# Patient Record
Sex: Male | Born: 1935 | Race: Asian | Hispanic: No | Marital: Married | Smoking: Current every day smoker
Health system: Southern US, Community
[De-identification: ages and names within clinical notes are randomized; demographics above are authoritative.]

## PROBLEM LIST (undated history)

## (undated) DIAGNOSIS — K259 Gastric ulcer, unspecified as acute or chronic, without hemorrhage or perforation: Secondary | ICD-10-CM

## (undated) HISTORY — PX: APPENDECTOMY: SHX54

---

## 1972-02-16 HISTORY — PX: ABDOMINAL SURGERY: SHX537

## 2016-11-11 ENCOUNTER — Inpatient Hospital Stay (HOSPITAL_COMMUNITY): Payer: Self-pay

## 2016-11-11 ENCOUNTER — Encounter: Payer: Self-pay | Admitting: Emergency Medicine

## 2016-11-11 ENCOUNTER — Encounter (HOSPITAL_COMMUNITY): Payer: Self-pay | Admitting: Emergency Medicine

## 2016-11-11 ENCOUNTER — Inpatient Hospital Stay (HOSPITAL_COMMUNITY)
Admission: EM | Admit: 2016-11-11 | Discharge: 2016-11-14 | DRG: 470 | Disposition: A | Discharge status: Home / Self Care | Payer: Self-pay | Attending: Nephrology | Admitting: Nephrology

## 2016-11-11 ENCOUNTER — Emergency Department (INDEPENDENT_AMBULATORY_CARE_PROVIDER_SITE_OTHER): Payer: Self-pay

## 2016-11-11 ENCOUNTER — Emergency Department (HOSPITAL_COMMUNITY): Payer: Self-pay

## 2016-11-11 ENCOUNTER — Emergency Department
Admission: EM | Admit: 2016-11-11 | Discharge: 2016-11-11 | Disposition: A | Discharge status: Home / Self Care | Payer: Self-pay | Source: Home / Self Care | Attending: Family Medicine | Admitting: Family Medicine

## 2016-11-11 DIAGNOSIS — T148XXA Other injury of unspecified body region, initial encounter: Secondary | ICD-10-CM

## 2016-11-11 DIAGNOSIS — S72011A Unspecified intracapsular fracture of right femur, initial encounter for closed fracture: Secondary | ICD-10-CM

## 2016-11-11 DIAGNOSIS — M810 Age-related osteoporosis without current pathological fracture: Secondary | ICD-10-CM | POA: Diagnosis present

## 2016-11-11 DIAGNOSIS — W19XXXA Unspecified fall, initial encounter: Secondary | ICD-10-CM

## 2016-11-11 DIAGNOSIS — Z85028 Personal history of other malignant neoplasm of stomach: Secondary | ICD-10-CM

## 2016-11-11 DIAGNOSIS — W1830XA Fall on same level, unspecified, initial encounter: Secondary | ICD-10-CM | POA: Diagnosis present

## 2016-11-11 DIAGNOSIS — S72001A Fracture of unspecified part of neck of right femur, initial encounter for closed fracture: Secondary | ICD-10-CM

## 2016-11-11 DIAGNOSIS — S72009A Fracture of unspecified part of neck of unspecified femur, initial encounter for closed fracture: Secondary | ICD-10-CM | POA: Diagnosis present

## 2016-11-11 DIAGNOSIS — I959 Hypotension, unspecified: Secondary | ICD-10-CM

## 2016-11-11 DIAGNOSIS — F17210 Nicotine dependence, cigarettes, uncomplicated: Secondary | ICD-10-CM | POA: Diagnosis present

## 2016-11-11 HISTORY — DX: Gastric ulcer, unspecified as acute or chronic, without hemorrhage or perforation: K25.9

## 2016-11-11 HISTORY — PX: HEMIARTHROPLASTY HIP: SUR652

## 2016-11-11 LAB — PROTIME-INR
INR: 1.15
Prothrombin Time: 14.6 seconds (ref 11.4–15.2)

## 2016-11-11 LAB — CBC WITH DIFFERENTIAL/PLATELET
BASOS PCT: 0 %
Basophils Absolute: 0 10*3/uL (ref 0.0–0.1)
Eosinophils Absolute: 0.2 10*3/uL (ref 0.0–0.7)
Eosinophils Relative: 1 %
HEMATOCRIT: 40.5 % (ref 39.0–52.0)
HEMOGLOBIN: 13.8 g/dL (ref 13.0–17.0)
LYMPHS ABS: 1.5 10*3/uL (ref 0.7–4.0)
Lymphocytes Relative: 13 %
MCH: 30.3 pg (ref 26.0–34.0)
MCHC: 34.1 g/dL (ref 30.0–36.0)
MCV: 89 fL (ref 78.0–100.0)
MONOS PCT: 7 %
Monocytes Absolute: 0.8 10*3/uL (ref 0.1–1.0)
NEUTROS ABS: 8.5 10*3/uL — AB (ref 1.7–7.7)
NEUTROS PCT: 79 %
Platelets: 194 10*3/uL (ref 150–400)
RBC: 4.55 MIL/uL (ref 4.22–5.81)
RDW: 15.4 % (ref 11.5–15.5)
WBC: 10.9 10*3/uL — ABNORMAL HIGH (ref 4.0–10.5)

## 2016-11-11 LAB — BASIC METABOLIC PANEL
ANION GAP: 10 (ref 5–15)
BUN: 15 mg/dL (ref 6–20)
CHLORIDE: 103 mmol/L (ref 101–111)
CO2: 25 mmol/L (ref 22–32)
Calcium: 9 mg/dL (ref 8.9–10.3)
Creatinine, Ser: 0.81 mg/dL (ref 0.61–1.24)
GFR calc non Af Amer: >60 mL/min (ref >60)
Glucose, Bld: 119 mg/dL — ABNORMAL HIGH (ref 65–99)
Potassium: 3.6 mmol/L (ref 3.5–5.1)
Sodium: 138 mmol/L (ref 135–145)

## 2016-11-11 LAB — TYPE AND SCREEN
ABO/RH(D): AB POS
ABO/RH(D): AB POS
ANTIBODY SCREEN: NEGATIVE
Antibody Screen: NEGATIVE

## 2016-11-11 LAB — ABO/RH
ABO/RH(D): AB POS
ABO/RH(D): AB POS

## 2016-11-11 MED ORDER — POLYETHYLENE GLYCOL 3350 17 G PO PACK
17.0000 g | PACK | Freq: Every day | ORAL | Status: DC | PRN
  Administered 2016-11-11 – 2016-11-14 (×3): 17 g via ORAL
  Filled 2016-11-11 (×2): qty 1

## 2016-11-11 MED ORDER — ENOXAPARIN SODIUM 40 MG/0.4ML ~~LOC~~ SOLN: 40.0000 mg | SUBCUTANEOUS | Status: DC

## 2016-11-11 MED ORDER — OXYCODONE HCL 5 MG PO TABS
5.0000 mg | ORAL_TABLET | ORAL | Status: DC | PRN
  Administered 2016-11-11 (×2): 5 mg via ORAL
  Filled 2016-11-11 (×2): qty 1

## 2016-11-11 MED ORDER — MORPHINE SULFATE (PF) 4 MG/ML IV SOLN: 0.5000 mg | INTRAVENOUS | Status: DC | PRN

## 2016-11-11 MED ORDER — FENTANYL CITRATE (PF) 100 MCG/2ML IJ SOLN
50.0000 ug | Freq: Once | INTRAMUSCULAR | Status: AC
  Administered 2016-11-11: 50 ug via INTRAVENOUS
  Filled 2016-11-11: qty 2

## 2016-11-11 MED ORDER — MORPHINE SULFATE (PF) 2 MG/ML IV SOLN: 0.5000 mg | INTRAVENOUS | Status: DC | PRN

## 2016-11-11 NOTE — H&P (Signed)
[facility] History and Physical  Caleb Hull ZOX:[id number] DOB: [birth year] DOA: 11/11/2016  Referring physician:  PCP: Patient, No Pcp Per   Chief Complaint:    HPI:  83 year old male with a  remote history of gastric cancer, who presents to the ED after a fall while playing ping-pong yesterday. Patient is  physically very fit, according to the son smokes one pack a day. Has no history of hypertension, no history of pulmonary disease, no cardiac disease. No history of atrial fibrillation and congestive heart failure. Patient denies any recent illness, denies loss of consciousness or head trauma ED course BP 124/60 (BP Location: Right Arm)   Pulse 62   Temp 98.6 F (37 C) (Oral)   Resp 16   SpO2 96%  Patient is pain-free, hemodynamically stable, chest x-ray negative, shows tiny bilateral pleural effusions, x-ray of the hip showed subcapital femoral neck fracture. Patient is being transferred to [facility] for repair. EDP already spoke with Dr. Everardo Pacific, Orthopedics.     Review of Systems: negative for the following  Constitutional: Denies fever, chills, diaphoresis, appetite change and fatigue.  HEENT: Denies photophobia, eye pain, redness, hearing loss, ear pain, congestion, sore throat, rhinorrhea, sneezing, mouth sores, trouble swallowing, neck pain, neck stiffness and tinnitus.  Respiratory: Denies SOB, DOE, cough, chest tightness, and wheezing.  Cardiovascular: Denies chest pain, palpitations and leg swelling.  Gastrointestinal: Denies nausea, vomiting, abdominal pain, diarrhea, constipation, blood in stool and abdominal distention.  Genitourinary: Denies dysuria, urgency, frequency, hematuria, flank pain and difficulty urinating.  Musculoskeletal:  Right hip pain, difficulty ambulating Skin: Denies pallor, rash and wound.  Neurological: Denies dizziness, seizures, syncope, weakness, light-headedness, numbness and headaches.  Hematological: Denies adenopathy. Easy bruising,  personal or family bleeding history  Psychiatric/Behavioral: Denies suicidal ideation, mood changes, confusion, nervousness, sleep disturbance and agitation       Past Medical History:  Diagnosis Date  . Gastric ulcer      Past Surgical History:  Procedure Laterality Date  . ABDOMINAL SURGERY        Social History:  reports that he has been smoking Cigarettes.  He has a 70.00 pack-year smoking history. He has never used smokeless tobacco. He reports that he does not drink alcohol or use drugs.    No Known Allergies      FAMILY HISTORY  When questioned  Directly-patient reports  No family history of HTN, CVA ,DIABETES, TB, Cancer CAD, Bleeding Disorders, Sickle Cell, diabetes, anemia, asthma,   Prior to Admission medications   Medication Sig Start Date End Date Taking? Authorizing Provider  ibuprofen (ADVIL,MOTRIN) 200 MG tablet Take 200 mg by mouth every 6 (six) hours as needed for fever, headache, mild pain, moderate pain or cramping.    Yes [provider]     Physical Exam: Vitals:   11/11/16 1304  BP: 124/60  Pulse: 62  Resp: 16  Temp: 98.6 F (37 C)  TempSrc: Oral  SpO2: 96%        Vitals:   11/11/16 1304  BP: 124/60  Pulse: 62  Resp: 16  Temp: 98.6 F (37 C)  TempSrc: Oral  SpO2: 96%   Constitutional: NAD, calm, comfortable Eyes: PERRL, lids and conjunctivae normal ENMT: Mucous membranes are moist. Posterior pharynx clear of any exudate or lesions.Normal dentition.  Neck: normal, supple, no masses, no thyromegaly Respiratory: clear to auscultation bilaterally, no wheezing, no crackles. Normal respiratory effort. No accessory muscle use.  Cardiovascular: Regular rate and rhythm, no murmurs / rubs / gallops.  No extremity edema. 2+ pedal pulses. No carotid bruits.  Abdomen: no tenderness, no masses palpated. No hepatosplenomegaly. Bowel sounds positive.  Musculoskeletal: no clubbing / cyanosis. No joint deformity upper and lower  extremities. Good ROM, no contractures. Normal muscle tone.  Skin: no rashes, lesions, ulcers. No induration Neurologic: CN 2-12 grossly intact. Sensation intact, DTR normal. Strength 5/5 in all 4.  Psychiatric: Normal judgment and insight. Alert and oriented x 3. Normal mood.     Labs on Admission: I have personally reviewed following labs and imaging studies  CBC:  Recent Labs Lab 11/11/16 1356  WBC 10.9*  NEUTROABS 8.5*  HGB 13.8  HCT 40.5  MCV 89.0  PLT 194    Basic Metabolic Panel:  Recent Labs Lab 11/11/16 1356  NA 138  K 3.6  CL 103  CO2 25  GLUCOSE 119*  BUN 15  CREATININE 0.81  CALCIUM 9.0    GFR: Estimated Creatinine Clearance: 49.3 mL/min (by C-G formula based on SCr of 0.81 mg/dL).  Liver Function Tests: No results for input(s): AST, ALT, ALKPHOS, BILITOT, PROT, ALBUMIN in the last 168 hours. No results for input(s): LIPASE, AMYLASE in the last 168 hours. No results for input(s): AMMONIA in the last 168 hours.  Coagulation Profile: No results for input(s): INR, PROTIME in the last 168 hours. No results for input(s): DDIMER in the last 72 hours.  Cardiac Enzymes: No results for input(s): CKTOTAL, CKMB, CKMBINDEX, TROPONINI in the last 168 hours.  BNP (last 3 results) No results for input(s): PROBNP in the last 8760 hours.  HbA1C: No results for input(s): HGBA1C in the last 72 hours. No results found for: HGBA1C   CBG: No results for input(s): GLUCAP in the last 168 hours.  Lipid Profile: No results for input(s): CHOL, HDL, LDLCALC, TRIG, CHOLHDL, LDLDIRECT in the last 72 hours.  Thyroid Function Tests: No results for input(s): TSH, T4TOTAL, FREET4, T3FREE, THYROIDAB in the last 72 hours.  Anemia Panel: No results for input(s): VITAMINB12, FOLATE, FERRITIN, TIBC, IRON, RETICCTPCT in the last 72 hours.  Urine analysis: No results found for: COLORURINE, APPEARANCEUR, LABSPEC, PHURINE, GLUCOSEU, HGBUR, BILIRUBINUR, KETONESUR, PROTEINUR,  UROBILINOGEN, NITRITE, LEUKOCYTESUR  Sepsis Labs: (procalcitonin:4,lacticidven:4) )No results found for this or any previous visit (from the past 240 hour(s)).       Radiological Exams on Admission: Dg Hip Unilat W Or Wo Pelvis 2-3 Views Right  Addendum Date: 11/11/2016   ADDENDUM REPORT: 11/11/2016 10:43 ADDENDUM: There is a subcapital femoral neck fracture on the right, best seen on lateral view. I discussed this finding with the referring physician. Electronically Signed   By: Bretta Bang III M.D.   On: 11/11/2016 10:43   Result Date: 11/11/2016 CLINICAL DATA:  Pain following fall EXAM: DG HIP (WITH OR WITHOUT PELVIS) 2-3V RIGHT COMPARISON:  None. FINDINGS: Frontal pelvis as well as frontal and lateral right hip images were obtained. Bones are osteoporotic. No acute fracture or dislocation. There is slight narrowing of each hip joint. No erosive change. Sacroiliac joints appear unremarkable. IMPRESSION: No fracture or dislocation. Slight symmetric narrowing both hip joints. Bones are osteoporotic. Electronically Signed: By: Bretta Bang III M.D. On: 11/11/2016 10:22   Dg Hip Unilat W Or Wo Pelvis 2-3 Views Right  Addendum Date: 11/11/2016   ADDENDUM REPORT: 11/11/2016 10:43 ADDENDUM: There is a subcapital femoral neck fracture on the right, best seen on lateral view. I discussed this finding with the referring physician. Electronically Signed   By: Bretta Bang III M.D.  On: 11/11/2016 10:43   Result Date: 11/11/2016 CLINICAL DATA:  Pain following fall EXAM: DG HIP (WITH OR WITHOUT PELVIS) 2-3V RIGHT COMPARISON:  None. FINDINGS: Frontal pelvis as well as frontal and lateral right hip images were obtained. Bones are osteoporotic. No acute fracture or dislocation. There is slight narrowing of each hip joint. No erosive change. Sacroiliac joints appear unremarkable. IMPRESSION: No fracture or dislocation. Slight symmetric narrowing both hip joints. Bones are  osteoporotic. Electronically Signed: By: Bretta Bang III M.D. On: 11/11/2016 10:22      EKG: Independently reviewed.  Sinus rhythm RSR' in V1 or V2, probably normal variant No old tracing to compare Nonspecific ST and T wave abnormality   Assessment/Plan Principal Problem:   Subcapital fracture of femur, right, closed, initial encounter (HCC)  Hip fracture protocol initiated Low risk for surgery given no previous medical history DVT prophylaxis per orthopedics Follow-up CBC.    DVT prophylaxis:  Lovenox     Code Status Orders full code        consults called: Orthopedics  Family Communication: Admission, patients condition and plan of care including tests being ordered have been discussed with the patient  who indicates understanding and agree with the plan and Code Status  Admission status: inpatient   Disposition plan: Further plan will depend as patient's clinical course evolves and further radiologic and laboratory data become available. Likely home when stable   At the time of admission, it appears that the appropriate admission status for this patient is INPATIENT .Thisis judged to be reasonable and necessary in order to provide the required intensity of service to ensure the patient's safetygiven thepresenting symptoms, physical exam findings, and initial radiographic and laboratory data in the context of their chronic comorbidities.   Richarda Overlie MD [facility] Pager [phone]  If 7PM-7AM, please contact night-coverage [email or web address] Password [email or web address]  11/11/2016, 2:40 PM

## 2016-11-11 NOTE — ED Triage Notes (Signed)
Upper right thigh injury from fall last night playing ping pong.

## 2016-11-11 NOTE — ED Triage Notes (Signed)
Patient BIB daughter and son-in-law, visiting from [place], reports fall last night. Sent from [facility] for positive femoral fracture. Patient denies pain at this time.

## 2016-11-11 NOTE — ED Provider Notes (Signed)
[facility]    CSN: [id number] Arrival date & time: 11/11/16  0902     History   Chief Complaint Chief Complaint  Patient presents with  . Fall    HPI Caleb Hull is a 83 y.o. male.  Pt is [place], accompanied by son-in-law who speaks fluent [place]. Declined professional interpreter.   HPI  Caleb Hull is a 83 y.o. male presenting to UC with son-in-law and daughter with c/o sudden onset Right hip pain that started last night while playing ping-pong. Pt was stretched out to his Right trying to reach the ball but he wound up falling "softly" onto his Right side.  Since then, pt has been hesitant to bear weight on Right leg due to pain.  Pt typically is very active and does not use a walker or cane. Today, he cannot ambulate w/o assistance.  No other injuries. Denies hitting his head during the fall. He is not on any medications.  They have tried a topical skin patch with medication from [place] but no relief.  He has not noticed bruising to his leg or hip.   History reviewed. No pertinent past medical history.  Patient Active Problem List   Diagnosis Date Noted  . Subcapital fracture of femur, right, closed, initial encounter (HCC) 11/11/2016    Past Surgical History:  Procedure Laterality Date  . ABDOMINAL SURGERY         Home Medications    Prior to Admission medications   Medication Sig Start Date End Date Taking? Authorizing Provider  ibuprofen (ADVIL,MOTRIN) 200 MG tablet Take 200 mg by mouth every 6 (six) hours as needed.   Yes [provider]    Family History No family history on file.  Social History Social History  Substance Use Topics  . Smoking status: Current Every Day Smoker    Packs/day: 1.00    Years: 70.00    Types: Cigarettes  . Smokeless tobacco: Never Used  . Alcohol use No     Allergies   Patient has no known allergies.   Review of Systems Review of Systems  Musculoskeletal: Positive for arthralgias and  myalgias. Negative for joint swelling.  Skin: Negative for color change and wound.  Neurological: Positive for weakness (Right leg/hip due to pain). Negative for numbness.     Physical Exam Triage Vital Signs ED Triage Vitals  Enc Vitals Group     BP 11/11/16 0937 117/63     Pulse Rate 11/11/16 0937 62     Resp --      Temp 11/11/16 0937 98.9 F (37.2 C)     Temp Source 11/11/16 0937 Oral     SpO2 11/11/16 0937 97 %     Weight 11/11/16 0939 113 lb (51.3 kg)     Height 11/11/16 0939  (1.651 m)     Head Circumference --      Peak Flow --      Pain Score 11/11/16 0939 7     Pain Loc --      Pain Edu? --      Excl. in GC? --    No data found.   Updated Vital Signs BP 117/63 (BP Location: Left Arm)   Pulse 62   Temp 98.9 F (37.2 C) (Oral)   Ht  (1.651 m)   Wt 113 lb (51.3 kg)   SpO2 97%   BMI 18.80 kg/m   Visual Acuity Right Eye Distance:   Left Eye Distance:   Bilateral  Distance:    Right Eye Near:   Left Eye Near:    Bilateral Near:     Physical Exam  Constitutional: He is oriented to person, place, and time. He appears well-developed and well-nourished. No distress.  Pt sitting in a wheelchair, NAD  HENT:  Head: Normocephalic and atraumatic.  Eyes: EOM are normal.  Neck: Normal range of motion.  Cardiovascular: Normal rate.   Pulmonary/Chest: Effort normal.  Musculoskeletal: He exhibits tenderness. He exhibits no edema.  Right leg: minimally internally rotated. Minimal tenderness to hip joint. Tenderness to anterior thigh. Severe pain with attempts to move Right hip. Calf is soft, non-tender. Did not attempt to stand pt due to severe pain.  Neurological: He is alert and oriented to person, place, and time.  Skin: Skin is warm and dry. He is not diaphoretic.  Right leg: skin in tact. No ecchymosis or erythema  Psychiatric: He has a normal mood and affect. His behavior is normal.  Nursing note and vitals reviewed.    UC Treatments / Results   Labs (all labs ordered are listed, but only abnormal results are displayed) Labs Reviewed - No data to display  EKG  EKG Interpretation None       Radiology Dg Hip Unilat W Or Wo Pelvis 2-3 Views Right  Addendum Date: 11/11/2016   ADDENDUM REPORT: 11/11/2016 10:43 ADDENDUM: There is a subcapital femoral neck fracture on the right, best seen on lateral view. I discussed this finding with the referring physician. Electronically Signed   By: Bretta Bang III M.D.   On: 11/11/2016 10:43   Result Date: 11/11/2016 CLINICAL DATA:  Pain following fall EXAM: DG HIP (WITH OR WITHOUT PELVIS) 2-3V RIGHT COMPARISON:  None. FINDINGS: Frontal pelvis as well as frontal and lateral right hip images were obtained. Bones are osteoporotic. No acute fracture or dislocation. There is slight narrowing of each hip joint. No erosive change. Sacroiliac joints appear unremarkable. IMPRESSION: No fracture or dislocation. Slight symmetric narrowing both hip joints. Bones are osteoporotic. Electronically Signed: By: Bretta Bang III M.D. On: 11/11/2016 10:22    Procedures Procedures (including critical care time)  Medications Ordered in UC Medications - No data to display   Initial Impression / Assessment and Plan / UC Course  I have reviewed the triage vital signs and the nursing notes.  Pertinent labs & imaging results that were available during my care of the patient were reviewed by me and considered in my medical decision making (see chart for details).     Pt presenting to UC with Right hip pain and unable to bear weight Plain films c/w closed fracture at femoral neck.    Consulted with Dr. Benjamin Stain who also evaluated pt. Recommends pt be seen by an orthopedic surgeon today for emergent surgery.  See consult note.   Pt discharged in stable condition via POV to [facility].     Final Clinical Impressions(s) / UC Diagnoses   Final diagnoses:  Closed fracture of neck of right femur,  initial encounter [facility])    New Prescriptions Discharge Medication List as of 11/11/2016 11:59 AM       Controlled Substance Prescriptions  [place] Controlled Substance Registry consulted? Not Applicable   Rolla Plate 11/11/16 1211

## 2016-11-11 NOTE — Consult Note (Signed)
   Subjective:    I'm seeing this patient as a consultation for:  Waylan Rocher PA-C  CC: right hip pain  HPI: This is a pleasant 83 year old male visiting from [place], he was playing ping-pong and fell, he had immediate pain at his right hip, and difficulty bearing weight. He was able to minimally bear weight with help from his family. He was brought to urgent care today, where x-rays showed a subcapital femoral neck fracture. Pain is severe, persistent, he does not speaking which.  Past medical history, Surgical history, Family history not pertinant except as noted below, Social history, Allergies, and medications have been entered into the medical record, reviewed, and no changes needed.   Review of Systems: No headache, visual changes, nausea, vomiting, diarrhea, constipation, dizziness, abdominal pain, skin rash, fevers, chills, night sweats, weight loss, swollen lymph nodes, body aches, joint swelling, muscle aches, chest pain, shortness of breath, mood changes, visual or auditory hallucinations.   Objective:   General: Well Developed, well nourished, and in no acute distress.  Neuro:  Extra-ocular muscles intact, able to move all 4 extremities, sensation grossly intact.  Deep tendon reflexes tested were normal. Psych: Alert and oriented, mood congruent with affect. ENT:  Ears and nose appear unremarkable.  Hearing grossly normal. Neck: Unremarkable overall appearance, trachea midline.  No visible thyroid enlargement. Eyes: Conjunctivae and lids appear unremarkable.  Pupils equal and round. Skin: Warm and dry, no rashes noted.  Cardiovascular: Pulses palpable, no extremity edema. Right hip: Tender diffusely, severe pain with internal rotation.  X-rays personally reviewed and show a minimally displaced some Fracture of the right femoral neck, x-rays discussed with radiology.  Impression and Recommendations:   This case required medical decision making of moderate  complexity.  Subcapital fracture of femur, right, closed, initial encounter (HCC) Subcapital femoral neck fracture, displaced. This is going to need surgical fixation versus arthroplasty today. Patient will be referred to [facility]. This is a healthy male on no medications, greater than 4 metabolic equivalents of exercise tolerance.  ___________________________________________ Ihor Austin. Benjamin Stain, M.D., ABFM., CAQSM. Primary Care and Sports Medicine [facility]  Adjunct Instructor of Family Medicine  [employer]

## 2016-11-11 NOTE — ED Notes (Signed)
[facility] WITH PT WITH [facility]

## 2016-11-11 NOTE — Discharge Instructions (Signed)
° °  When you get to [facility], go to the emergency department and pull right up front. Let staff know you need help getting him out of the car due to a confirmed Right hip fracture.  Dr. Karie Schwalbe from [facility] has spoken with surgeon and directed you to [facility].

## 2016-11-11 NOTE — ED Notes (Signed)
Coming from a sports medicine office-has a femoral fracture-Ortho MD on call is aware patient is coming to ED for work up for possible surgery

## 2016-11-11 NOTE — Assessment & Plan Note (Addendum)
Subcapital femoral neck fracture, displaced. This is going to need surgical fixation versus arthroplasty today. Patient will be referred to [facility]. This is a healthy male on no medications, greater than 4 metabolic equivalents of exercise tolerance.

## 2016-11-11 NOTE — Clinical Social Work Note (Signed)
Clinical Social Work Assessment  Patient Details  Name: Caleb Hull MRN: [id number] Date of Birth: [birth year]  Date of referral:  11/11/16               Reason for consult:  Facility Placement                Permission sought to share information with:  Oceanographer granted to share information::  No  Name::        Agency::     Relationship::     Contact Information:     Housing/Transportation Living arrangements for the past 2 months:  Single Family Home Source of Information:   (Pt's son) Patient Interpreter Needed:  None Criminal Activity/Legal Involvement Pertinent to Current Situation/Hospitalization:    Significant Relationships:  Adult Children Lives with:   (Currently stayting with his son, after pt recently visited from [place]) Do you feel safe going back to the place where you live?  Yes Need for family participation in patient care:  Yes (Comment) (Pt's son translates for his father)  Care giving concerns:  None listed by pt/family.  Pt's son states family has no need of HHC or SNF REHAB, but will return to [place] where "healthcare is taken care of", per pt's son.  Per pt's son Caleb Hull "family has everything taken care of" and pt will return to son's house until "he can get around" and then pt will return to [place], per pt's son.   Social Worker assessment / plan:  CSW spoke with pt's son by phone and confirmed pt's plan to be discharged to pt's son's house to recuperate until returning to [place].  CSW provided active listening and validated pt's son's concerns that pt needs no services.   CSW DEP was NOT given permission to complete FL-2 and send referrals out to SNF facilities via the hub per pt's request.  Pt does NOT have insurance.  Pt has been at his son's home while visiting from [place], prior to being admitted to [facility].   Employment status:  Retired Health and safety inspector:  Self Pay (Medicaid Pending) PT Recommendations:  Not assessed at this  time Information / Referral to community resources:     Patient/Family's Response to care:  Patient/family is realistic regarding therapy needs and expressed being hopeful pt can "get around quickly" and return to [place] for care. Patient's son speaks [place], pt does not and pt expressed understanding of CSW role and discharge process. No questions/concerns about plan or treatment.    Patient/Family's Understanding of and Emotional Response to Diagnosis, Current Treatment, and Prognosis:  Still assessing  Emotional Assessment Appearance:   (Unknown, CSW spoke by phone) Attitude/Demeanor/Rapport:  Unable to Assess Affect (typically observed):    Orientation:   (Unknown) Alcohol / Substance use:    Psych involvement (Current and /or in the community):     Discharge Needs  Concerns to be addressed:  No discharge needs identified Readmission within the last 30 days:  No Current discharge risk:  None Barriers to Discharge:  No Barriers Identified   Mercy Riding, LCSWA 11/11/2016, 9:43 PM

## 2016-11-11 NOTE — Progress Notes (Signed)
Called about patient this afternoon.  Due to timing and OR availability plan for OR tomorrow with Dr. Veda Canning.  Patient posted, hold chemical dvt prophylaxis and please make NPO at midnight.  Discussed plan with Dr. Susie Cassette.

## 2016-11-11 NOTE — ED Provider Notes (Addendum)
[facility]-EMERGENCY DEPT Provider Note   CSN: [id number] Arrival date & time: 11/11/16  1229     History   Chief Complaint Chief Complaint  Patient presents with  . Hip Injury    HPI Caleb Hull is a 83 y.o. male.  HPI Pt comes in with cc of fall. Pt had a fall whilst playing ping pong yday.He has no medical hx. He was unable to get up and walk, and the pain progressed so he was seen at [facility] where they noted that pt had a fracture. He denies n/v/f/c. No headaches. Pt is not on aspirin on blood thinners.  Son translated. Pt and son insisted on not needing translation services.  Past Medical History:  Diagnosis Date  . Gastric ulcer     Patient Active Problem List   Diagnosis Date Noted  . Subcapital fracture of femur, right, closed, initial encounter (HCC) 11/11/2016    Past Surgical History:  Procedure Laterality Date  . ABDOMINAL SURGERY         Home Medications    Prior to Admission medications   Medication Sig Start Date End Date Taking? Authorizing Provider  ibuprofen (ADVIL,MOTRIN) 200 MG tablet Take 200 mg by mouth every 6 (six) hours as needed.    [provider]    Family History No family history on file.  Social History Social History  Substance Use Topics  . Smoking status: Current Every Day Smoker    Packs/day: 1.00    Years: 70.00    Types: Cigarettes  . Smokeless tobacco: Never Used  . Alcohol use No     Allergies   Patient has no known allergies.   Review of Systems Review of Systems  Constitutional: Positive for activity change.  Respiratory: Negative for stridor.   Cardiovascular: Negative for chest pain.  Musculoskeletal: Positive for gait problem.  Neurological: Negative for headaches.  Hematological: Does not bruise/bleed easily.     Physical Exam Updated Vital Signs BP 124/60 (BP Location: Right Arm)   Pulse 62   Temp 98.6 F (37 C) (Oral)   Resp 16   SpO2 96%   Physical Exam  Constitutional: He is  oriented to person, place, and time. He appears well-developed.  HENT:  Head: Atraumatic.  Neck: Neck supple.  Cardiovascular: Normal rate.   Pulmonary/Chest: Effort normal.  Musculoskeletal:  Pt has tenderness over the R hip  Neurological: He is alert and oriented to person, place, and time.  Skin: Skin is warm.  Nursing note and vitals reviewed.    ED Treatments / Results  Labs (all labs ordered are listed, but only abnormal results are displayed) Labs Reviewed  CBC WITH DIFFERENTIAL/PLATELET - Abnormal; Notable for the following:       Result Value   WBC 10.9 (*)    Neutro Abs 8.5 (*)    All other components within normal limits  BASIC METABOLIC PANEL  PROTIME-INR  TYPE AND SCREEN    EKG  EKG Interpretation  Date/Time:  Thursday November 11 2016 14:13:19 EDT Ventricular Rate:  63 PR Interval:    QRS Duration: 90 QT Interval:  456 QTC Calculation: 467 R Axis:   62 Text Interpretation:  Sinus rhythm RSR' in V1 or V2, probably normal variant No old tracing to compare Nonspecific ST and T wave abnormality Confirmed by Derwood Kaplan [phone]) on 11/11/2016 2:19:35 PM       Radiology Dg Hip Unilat W Or Wo Pelvis 2-3 Views Right  Addendum Date: 11/11/2016   ADDENDUM REPORT:  11/11/2016 10:43 ADDENDUM: There is a subcapital femoral neck fracture on the right, best seen on lateral view. I discussed this finding with the referring physician. Electronically Signed   By: Bretta Bang III M.D.   On: 11/11/2016 10:43   Result Date: 11/11/2016 CLINICAL DATA:  Pain following fall EXAM: DG HIP (WITH OR WITHOUT PELVIS) 2-3V RIGHT COMPARISON:  None. FINDINGS: Frontal pelvis as well as frontal and lateral right hip images were obtained. Bones are osteoporotic. No acute fracture or dislocation. There is slight narrowing of each hip joint. No erosive change. Sacroiliac joints appear unremarkable. IMPRESSION: No fracture or dislocation. Slight symmetric narrowing both hip joints.  Bones are osteoporotic. Electronically Signed: By: Bretta Bang III M.D. On: 11/11/2016 10:22    Procedures Procedures (including critical care time)  Medications Ordered in ED Medications  fentaNYL (SUBLIMAZE) injection 50 mcg (50 mcg Intravenous Given 11/11/16 1414)     Initial Impression / Assessment and Plan / ED Course  I have reviewed the triage vital signs and the nursing notes.  Pertinent labs & imaging results that were available during my care of the patient were reviewed by me and considered in my medical decision making (see chart for details).     DDx includes: - Mechanical falls - ICH - Fractures - Contusions - Soft tissue injury  Pt comes in with a mechanical fall and he ended up with a R femoral neck fracture. Brain and cspine cleared clinically. Spoke with Dr. Everardo Pacific, Orthopedics. He wants medicine to admit, keep patient npo and transfer to [facility] - they migjht be able to operate later today.   Final Clinical Impressions(s) / ED Diagnoses   Final diagnoses:  Closed fracture of neck of right femur, initial encounter [facility])    New Prescriptions New Prescriptions   No medications on file     Derwood Kaplan, MD 11/11/16 1404    Derwood Kaplan, MD 11/11/16 1419

## 2016-11-12 ENCOUNTER — Inpatient Hospital Stay (HOSPITAL_COMMUNITY): Payer: Self-pay | Admitting: Anesthesiology

## 2016-11-12 ENCOUNTER — Encounter (HOSPITAL_COMMUNITY): Payer: Self-pay | Admitting: *Deleted

## 2016-11-12 ENCOUNTER — Telehealth: Payer: Self-pay | Admitting: Emergency Medicine

## 2016-11-12 ENCOUNTER — Inpatient Hospital Stay (HOSPITAL_COMMUNITY): Payer: Self-pay

## 2016-11-12 ENCOUNTER — Encounter (HOSPITAL_COMMUNITY)
Admission: EM | Disposition: A | Discharge status: Home / Self Care | Payer: Self-pay | Source: Home / Self Care | Attending: Nephrology

## 2016-11-12 DIAGNOSIS — I959 Hypotension, unspecified: Secondary | ICD-10-CM

## 2016-11-12 DIAGNOSIS — S72001A Fracture of unspecified part of neck of right femur, initial encounter for closed fracture: Secondary | ICD-10-CM | POA: Diagnosis present

## 2016-11-12 DIAGNOSIS — S72011A Unspecified intracapsular fracture of right femur, initial encounter for closed fracture: Principal | ICD-10-CM

## 2016-11-12 HISTORY — PX: ANTERIOR APPROACH HEMI HIP ARTHROPLASTY: SHX6690

## 2016-11-12 LAB — SURGICAL PCR SCREEN
MRSA, PCR: NEGATIVE
Staphylococcus aureus: NEGATIVE

## 2016-11-12 SURGERY — HEMIARTHROPLASTY, HIP, DIRECT ANTERIOR APPROACH, FOR FRACTURE
Anesthesia: General | Site: Hip | Laterality: Right

## 2016-11-12 MED ORDER — PROPOFOL 10 MG/ML IV BOLUS
INTRAVENOUS | Status: AC
  Filled 2016-11-12: qty 20

## 2016-11-12 MED ORDER — ONDANSETRON HCL 4 MG/2ML IJ SOLN: 4.0000 mg | Freq: Four times a day (QID) | INTRAMUSCULAR | Status: DC | PRN

## 2016-11-12 MED ORDER — DEXAMETHASONE SODIUM PHOSPHATE 10 MG/ML IJ SOLN
INTRAMUSCULAR | Status: DC | PRN
  Administered 2016-11-12: 10 mg via INTRAVENOUS

## 2016-11-12 MED ORDER — HYDROCODONE-ACETAMINOPHEN 5-325 MG PO TABS
1.0000 | ORAL_TABLET | Freq: Four times a day (QID) | ORAL | Status: DC | PRN
  Administered 2016-11-13: 1 via ORAL
  Filled 2016-11-12 (×2): qty 1

## 2016-11-12 MED ORDER — METOCLOPRAMIDE HCL 5 MG/ML IJ SOLN: 5.0000 mg | Freq: Three times a day (TID) | INTRAMUSCULAR | Status: DC | PRN

## 2016-11-12 MED ORDER — ONDANSETRON HCL 4 MG/2ML IJ SOLN
INTRAMUSCULAR | Status: DC | PRN
Start: 2016-11-12 — End: 2016-11-12
  Administered 2016-11-12: 4 mg via INTRAVENOUS

## 2016-11-12 MED ORDER — NEOSTIGMINE METHYLSULFATE 10 MG/10ML IV SOLN
INTRAVENOUS | Status: DC | PRN
  Administered 2016-11-12: 3 mg via INTRAVENOUS

## 2016-11-12 MED ORDER — TRANEXAMIC ACID 1000 MG/10ML IV SOLN
1000.0000 mg | INTRAVENOUS | Status: AC
  Administered 2016-11-12: 1000 mg via INTRAVENOUS
  Filled 2016-11-12: qty 10

## 2016-11-12 MED ORDER — ACETAMINOPHEN 325 MG PO TABS: 650.0000 mg | ORAL_TABLET | Freq: Four times a day (QID) | ORAL | Status: DC | PRN

## 2016-11-12 MED ORDER — BUPIVACAINE-EPINEPHRINE (PF) 0.5% -1:200000 IJ SOLN
INTRAMUSCULAR | Status: AC
  Filled 2016-11-12: qty 30

## 2016-11-12 MED ORDER — SODIUM CHLORIDE 0.9 % IJ SOLN
INTRAMUSCULAR | Status: DC | PRN
  Administered 2016-11-12: 30 mL

## 2016-11-12 MED ORDER — ROCURONIUM BROMIDE 100 MG/10ML IV SOLN
INTRAVENOUS | Status: DC | PRN
  Administered 2016-11-12: 10 mg via INTRAVENOUS
  Administered 2016-11-12: 20 mg via INTRAVENOUS

## 2016-11-12 MED ORDER — GLYCOPYRROLATE 0.2 MG/ML IJ SOLN
INTRAMUSCULAR | Status: DC | PRN
  Administered 2016-11-12: 0.4 mg via INTRAVENOUS

## 2016-11-12 MED ORDER — SODIUM CHLORIDE 0.9 % IV SOLN
1000.0000 mg | INTRAVENOUS | Status: DC
  Filled 2016-11-12: qty 10

## 2016-11-12 MED ORDER — FENTANYL CITRATE (PF) 250 MCG/5ML IJ SOLN
INTRAMUSCULAR | Status: AC
  Filled 2016-11-12: qty 5

## 2016-11-12 MED ORDER — DEXTROSE 5 % IV SOLN: 3.0000 g | INTRAVENOUS | Status: DC

## 2016-11-12 MED ORDER — POVIDONE-IODINE 10 % EX SWAB: 2.0000 "application " | Freq: Once | CUTANEOUS | Status: DC

## 2016-11-12 MED ORDER — METHOCARBAMOL 1000 MG/10ML IJ SOLN
500.0000 mg | Freq: Four times a day (QID) | INTRAVENOUS | Status: DC | PRN
  Filled 2016-11-12: qty 5

## 2016-11-12 MED ORDER — CHLORHEXIDINE GLUCONATE 4 % EX LIQD: 60.0000 mL | Freq: Once | CUTANEOUS | Status: DC

## 2016-11-12 MED ORDER — LIDOCAINE HCL (CARDIAC) 20 MG/ML IV SOLN
INTRAVENOUS | Status: DC | PRN
  Administered 2016-11-12: 60 mg via INTRAVENOUS

## 2016-11-12 MED ORDER — CEFAZOLIN SODIUM-DEXTROSE 2-4 GM/100ML-% IV SOLN
2.0000 g | Freq: Four times a day (QID) | INTRAVENOUS | Status: AC
  Administered 2016-11-12 (×2): 2 g via INTRAVENOUS
  Filled 2016-11-12 (×2): qty 100

## 2016-11-12 MED ORDER — KETOROLAC TROMETHAMINE 30 MG/ML IJ SOLN
INTRAMUSCULAR | Status: DC | PRN
  Administered 2016-11-12: 30 mg via INTRA_ARTICULAR

## 2016-11-12 MED ORDER — MENTHOL 3 MG MT LOZG: 1.0000 | LOZENGE | OROMUCOSAL | Status: DC | PRN

## 2016-11-12 MED ORDER — METOCLOPRAMIDE HCL 5 MG PO TABS: 5.0000 mg | ORAL_TABLET | Freq: Three times a day (TID) | ORAL | Status: DC | PRN

## 2016-11-12 MED ORDER — FENTANYL CITRATE (PF) 100 MCG/2ML IJ SOLN: 25.0000 ug | INTRAMUSCULAR | Status: DC | PRN

## 2016-11-12 MED ORDER — 0.9 % SODIUM CHLORIDE (POUR BTL) OPTIME
TOPICAL | Status: DC | PRN
  Administered 2016-11-12: 1000 mL

## 2016-11-12 MED ORDER — BUPIVACAINE-EPINEPHRINE (PF) 0.5% -1:200000 IJ SOLN
INTRAMUSCULAR | Status: DC | PRN
  Administered 2016-11-12: 30 mL

## 2016-11-12 MED ORDER — METHOCARBAMOL 500 MG PO TABS: 500.0000 mg | ORAL_TABLET | Freq: Four times a day (QID) | ORAL | Status: DC | PRN

## 2016-11-12 MED ORDER — FENTANYL CITRATE (PF) 100 MCG/2ML IJ SOLN
INTRAMUSCULAR | Status: DC | PRN
  Administered 2016-11-12: 50 ug via INTRAVENOUS
  Administered 2016-11-12: 100 ug via INTRAVENOUS

## 2016-11-12 MED ORDER — CEFAZOLIN SODIUM-DEXTROSE 2-4 GM/100ML-% IV SOLN
2.0000 g | INTRAVENOUS | Status: AC
  Administered 2016-11-12: 2 g via INTRAVENOUS
  Filled 2016-11-12 (×2): qty 100

## 2016-11-12 MED ORDER — ACETAMINOPHEN 650 MG RE SUPP: 650.0000 mg | Freq: Four times a day (QID) | RECTAL | Status: DC | PRN

## 2016-11-12 MED ORDER — PHENYLEPHRINE HCL 10 MG/ML IJ SOLN
INTRAMUSCULAR | Status: DC | PRN
  Administered 2016-11-12 (×3): 80 ug via INTRAVENOUS
  Administered 2016-11-12 (×2): 40 ug via INTRAVENOUS
  Administered 2016-11-12: 80 ug via INTRAVENOUS

## 2016-11-12 MED ORDER — ONDANSETRON HCL 4 MG/2ML IJ SOLN: 4.0000 mg | Freq: Once | INTRAMUSCULAR | Status: DC | PRN

## 2016-11-12 MED ORDER — PHENOL 1.4 % MT LIQD: 1.0000 | OROMUCOSAL | Status: DC | PRN

## 2016-11-12 MED ORDER — LACTATED RINGERS IV SOLN
INTRAVENOUS | Status: DC
  Administered 2016-11-12: 09:00:00 via INTRAVENOUS

## 2016-11-12 MED ORDER — PROPOFOL 10 MG/ML IV BOLUS
INTRAVENOUS | Status: DC | PRN
Start: 2016-11-12 — End: 2016-11-12
  Administered 2016-11-12: 100 mg via INTRAVENOUS

## 2016-11-12 MED ORDER — LACTATED RINGERS IV SOLN
INTRAVENOUS | Status: DC | PRN
  Administered 2016-11-12 (×2): via INTRAVENOUS

## 2016-11-12 MED ORDER — EPHEDRINE SULFATE 50 MG/ML IJ SOLN
INTRAMUSCULAR | Status: DC | PRN
  Administered 2016-11-12 (×2): 20 mg via INTRAVENOUS

## 2016-11-12 MED ORDER — KETOROLAC TROMETHAMINE 30 MG/ML IJ SOLN
INTRAMUSCULAR | Status: AC
  Filled 2016-11-12: qty 1

## 2016-11-12 MED ORDER — ONDANSETRON HCL 4 MG PO TABS: 4.0000 mg | ORAL_TABLET | Freq: Four times a day (QID) | ORAL | Status: DC | PRN

## 2016-11-12 SURGICAL SUPPLY — 53 items
BLADE CLIPPER SURG (BLADE) IMPLANT
BLADE SAW SGTL 18X1.27X75 (BLADE) ×2 IMPLANT
BLADE SAW SGTL 18X1.27X75MM (BLADE) ×1
CAPT HIP HEMI 2 ×3 IMPLANT
CHLORAPREP W/TINT 26ML (MISCELLANEOUS) ×3 IMPLANT
COVER SURGICAL LIGHT HANDLE (MISCELLANEOUS) ×3 IMPLANT
DERMABOND ADVANCED (GAUZE/BANDAGES/DRESSINGS) ×4
DERMABOND ADVANCED .7 DNX12 (GAUZE/BANDAGES/DRESSINGS) ×2 IMPLANT
DRAPE C-ARM 42X72 X-RAY (DRAPES) ×3 IMPLANT
DRAPE IMP U-DRAPE 54X76 (DRAPES) ×6 IMPLANT
DRAPE STERI IOBAN 125X83 (DRAPES) ×3 IMPLANT
DRAPE U-SHAPE 47X51 STRL (DRAPES) ×9 IMPLANT
DRSG AQUACEL AG ADV 3.5X10 (GAUZE/BANDAGES/DRESSINGS) ×3 IMPLANT
ELECT BLADE 4.0 EZ CLEAN MEGAD (MISCELLANEOUS) ×3
ELECT REM PT RETURN 9FT ADLT (ELECTROSURGICAL) ×3
ELECTRODE BLDE 4.0 EZ CLN MEGD (MISCELLANEOUS) ×1 IMPLANT
ELECTRODE REM PT RTRN 9FT ADLT (ELECTROSURGICAL) ×1 IMPLANT
EVACUATOR 1/8 PVC DRAIN (DRAIN) IMPLANT
GLOVE BIO SURGEON STRL SZ8.5 (GLOVE) ×9 IMPLANT
GLOVE BIOGEL PI IND STRL 8.5 (GLOVE) ×1 IMPLANT
GLOVE BIOGEL PI INDICATOR 8.5 (GLOVE) ×2
GOWN STRL REUS W/ TWL LRG LVL3 (GOWN DISPOSABLE) ×2 IMPLANT
GOWN STRL REUS W/TWL 2XL LVL3 (GOWN DISPOSABLE) ×3 IMPLANT
GOWN STRL REUS W/TWL LRG LVL3 (GOWN DISPOSABLE) ×4
HANDPIECE INTERPULSE COAX TIP (DISPOSABLE) ×2
HOOD PEEL AWAY FACE SHEILD DIS (HOOD) ×6 IMPLANT
KIT BASIN OR (CUSTOM PROCEDURE TRAY) ×3 IMPLANT
KIT ROOM TURNOVER OR (KITS) ×3 IMPLANT
MANIFOLD NEPTUNE II (INSTRUMENTS) ×3 IMPLANT
MARKER SKIN DUAL TIP RULER LAB (MISCELLANEOUS) ×3 IMPLANT
NEEDLE SPNL 18GX3.5 QUINCKE PK (NEEDLE) ×3 IMPLANT
NS IRRIG 1000ML POUR BTL (IV SOLUTION) ×3 IMPLANT
PACK TOTAL JOINT (CUSTOM PROCEDURE TRAY) ×3 IMPLANT
PACK UNIVERSAL I (CUSTOM PROCEDURE TRAY) ×3 IMPLANT
PAD ARMBOARD 7.5X6 YLW CONV (MISCELLANEOUS) ×6 IMPLANT
SEALER BIPOLAR AQUA 6.0 (INSTRUMENTS) ×3 IMPLANT
SET HNDPC FAN SPRY TIP SCT (DISPOSABLE) ×1 IMPLANT
SUCTION FRAZIER HANDLE 10FR (MISCELLANEOUS) ×2
SUCTION TUBE FRAZIER 10FR DISP (MISCELLANEOUS) ×1 IMPLANT
SUT ETHIBOND NAB CT1 #1 30IN (SUTURE) ×6 IMPLANT
SUT MNCRL AB 3-0 PS2 18 (SUTURE) ×6 IMPLANT
SUT MON AB 2-0 CT1 36 (SUTURE) ×3 IMPLANT
SUT MON AB 5-0 PS2 18 (SUTURE) ×6 IMPLANT
SUT VIC AB 1 CT1 27 (SUTURE) ×2
SUT VIC AB 1 CT1 27XBRD ANBCTR (SUTURE) ×1 IMPLANT
SUT VIC AB 2-0 CT1 27 (SUTURE) ×2
SUT VIC AB 2-0 CT1 TAPERPNT 27 (SUTURE) ×1 IMPLANT
SUT VLOC 180 0 24IN GS25 (SUTURE) ×3 IMPLANT
SYR 50ML LL SCALE MARK (SYRINGE) ×3 IMPLANT
TOWEL OR 17X24 6PK STRL BLUE (TOWEL DISPOSABLE) ×3 IMPLANT
TOWEL OR 17X26 10 PK STRL BLUE (TOWEL DISPOSABLE) ×3 IMPLANT
TRAY FOLEY CATH SILVER 16FR (SET/KITS/TRAYS/PACK) IMPLANT
WATER STERILE IRR 1000ML POUR (IV SOLUTION) ×9 IMPLANT

## 2016-11-12 NOTE — Telephone Encounter (Signed)
Patient is just coming out of recovery waking up from surgery, so far so good.

## 2016-11-12 NOTE — Op Note (Signed)
OPERATIVE REPORT  SURGEON: Samson Frederic, MD   ASSISTANT: Hart Carwin, RNFA  PREOPERATIVE DIAGNOSIS: Displaced Right femoral neck fracture.   POSTOPERATIVE DIAGNOSIS: Displaced Right femoral neck fracture.   PROCEDURE: Right hip hemiarthroplasty, anterior approach.   IMPLANTS: DePuy Tri Lock stem, size 6, std offset, with a +5 mm spacer and a 52 mm monopolar head ball.  ANESTHESIA:  General  ANTIBIOTICS: 2g ancef.  ESTIMATED BLOOD LOSS: 150 mL.   DRAINS: None.  COMPLICATIONS: None   CONDITION: PACU - hemodynamically stable.   BRIEF CLINICAL NOTE: Caleb Hull is a 83 y.o. male with a displaced Right femoral neck fracture. The patient was admitted to the hospitalist service and underwent perioperative risk stratification and medical optimization. The risks, benefits, and alternatives to hemiarthroplasty were explained, and the patient elected to proceed.  PROCEDURE IN DETAIL: The patient was taken to the operating room and general anesthesia was induced on the hospital bed. The patient was then positioned on the Hana table. All bony prominences were well padded. The hip was prepped and draped in the normal sterile surgical fashion. A time-out was called verifying side and site of surgery. Antibiotics were given within 60 minutes of beginning the procedure.  The direct anterior approach to the hip was performed through the Hueter interval. Lateral femoral circumflex vessels were treated with the Auqumantys. The anterior capsule was exposed and an inverted T capsulotomy was made. Fracture hematoma was encountered and evacuated. The patient was found to have a comminuted Right subcapital femoral neck fracture. I freshened the femoral neck cut with a saw. I removed the femoral neck fragment. A corkscrew was placed into the head and the head was removed. This was passed to the back table and was measured.  Acetabular exposure was achieved. I examined the articular  cartilage which was intact. The labrum was intact. A 52 mm trial head was placed and found to have excellent fit.  I then gained femoral exposure taking care to protect the abductors and greater trochanter. This was performed using standard external rotation, extension, and adduction. The capsule was peeled off the inner aspect of the greater trochanter, taking care to preserve the short external rotators. A cookie cutter was used to enter the femoral canal, and then the femoral canal finder was used to confirm location. I then sequentially broached up to a size 6. Calcar planer was used on the femoral neck remnant. I paced a std neck and a 36 + 1.5 head ball.The hip was reduced. Leg lengths were checked fluoroscopically. The hip was dislocated and trial components were removed. I placed the real stem followed by the real spacer and head ball. A single reduction maneuver was performed and the hip was reduced. Fluoroscopy was used to confirm component position and leg lengths. At 90 degrees of external rotation and extension, the hip was stable to an anterior directed force.  The wound was copiously irrigated with normal saline solution. Marcaine solution was injected into the periarticular soft tissue. The wound was closed in layers using #1 Vicryl and V-Loc for the fascia, 2-0 Vicryl for the subcutaneous fat, 2-0 Monocryl for the deep dermal layer, 3-0 running Monocryl subcuticular stitch and glue for the skin. Once the glue was fully dried, an Aquacell Ag dressing was applied. The patient was then awakened from anesthesia and transported to the recovery room in stable condition. Sponge, needle, and instrument counts were correct at the end of the case x2. The patient tolerated the procedure well and there were  no known complications.

## 2016-11-12 NOTE — Progress Notes (Signed)
Pt's BP= 89/75. RN notified MD.

## 2016-11-12 NOTE — Anesthesia Preprocedure Evaluation (Addendum)
Anesthesia Evaluation  Patient identified by MRN, date of birth, ID band Patient awake    Reviewed: Allergy & Precautions, NPO status , Patient's Chart, lab work & pertinent test results  Airway Mallampati: I  TM Distance: >3 FB Neck ROM: Full    Dental  (+) Edentulous Lower, Edentulous Upper   Pulmonary Current Smoker,    Pulmonary exam normal breath sounds clear to auscultation       Cardiovascular negative cardio ROS Normal cardiovascular exam Rhythm:Regular Rate:Normal     Neuro/Psych negative neurological ROS  negative psych ROS   GI/Hepatic Neg liver ROS, PUD,   Endo/Other  negative endocrine ROS  Renal/GU negative Renal ROS  negative genitourinary   Musculoskeletal negative musculoskeletal ROS (+)   Abdominal   Peds  Hematology negative hematology ROS (+)   Anesthesia Other Findings   Reproductive/Obstetrics negative OB ROS                            Anesthesia Physical Anesthesia Plan  ASA: II  Anesthesia Plan: General   Post-op Pain Management:    Induction: Intravenous  PONV Risk Score and Plan: 3 and Ondansetron, Propofol infusion and Treatment may vary due to age or medical condition  Airway Management Planned: Oral ETT  Additional Equipment: None  Intra-op Plan:   Post-operative Plan: Extubation in OR  Informed Consent: I have reviewed the patients History and Physical, chart, labs and discussed the procedure including the risks, benefits and alternatives for the proposed anesthesia with the patient or authorized representative who has indicated his/her understanding and acceptance.   Dental advisory given  Plan Discussed with: CRNA  Anesthesia Plan Comments:         Anesthesia Quick Evaluation

## 2016-11-12 NOTE — Consult Note (Signed)
ORTHOPAEDIC CONSULTATION  REQUESTING PHYSICIAN: Maxie Barb, MD  PCP:  Patient, No Pcp Per  Chief Complaint: Right hip pain.  HPI: Caleb Hull is a 83 y.o. male who complains of right hip pain after sustaining a ground-level fall on 11/10/2016 while playing ping-pong with his son. The patient is visiting from [place]. The patient's son reports that he is a minimal ambulator. He denies other injuries. The patient's son served as a Nurse, learning disability.  Past Medical History:  Diagnosis Date  . Gastric ulcer    Past Surgical History:  Procedure Laterality Date  . ABDOMINAL SURGERY  1974   "removed an ulcer"  . APPENDECTOMY    . HEMIARTHROPLASTY HIP Right 11/11/2016   Social History   Social History  . Marital status: Married    Spouse name: N/A  . Number of children: N/A  . Years of education: N/A   Social History Main Topics  . Smoking status: Current Every Day Smoker    Packs/day: 1.00    Years: 70.00    Types: Cigarettes  . Smokeless tobacco: Never Used  . Alcohol use No  . Drug use: No  . Sexual activity: Not Asked   Other Topics Concern  . None   Social History Narrative  . None   History reviewed. No pertinent family history. No Known Allergies Prior to Admission medications   Medication Sig Start Date End Date Taking? Authorizing Provider  ibuprofen (ADVIL,MOTRIN) 200 MG tablet Take 200 mg by mouth every 6 (six) hours as needed for fever, headache, mild pain, moderate pain or cramping.    Yes [provider]   Dg Chest 1 View  Result Date: 11/11/2016 CLINICAL DATA:  CT fracture. EXAM: CHEST 1 VIEW COMPARISON:  No recent. FINDINGS: Mediastinum hilar structures normal. Cardiomegaly with normal pulmonary vascularity. No focal infiltrate. Mild bilateral pleural thickening/scarring versus tiny pleural effusions. No pneumothorax. Pneumothorax. IMPRESSION: 1. Tiny bilateral pleural effusions versus pleural scarring. No acute pulmonary disease noted. 2.   Cardiomegaly with normal pulmonary vascularity. Electronically Signed   By: Maisie Fus  Register   On: 11/11/2016 14:40   Dg Knee Right Port  Result Date: 11/12/2016 CLINICAL DATA:  Closed displaced fracture of the right femoral neck. EXAM: PORTABLE RIGHT KNEE - 1-2 VIEW COMPARISON:  Right hip radiographs from earlier on the same day. FINDINGS: AP and lateral views of the right knee are provided. No joint effusion, malalignment or fracture. IMPRESSION: No fracture or malalignment of the right knee.  No joint effusion. Electronically Signed   By: Tollie Eth M.D.   On: 11/12/2016 01:12   Dg Hip Unilat W Or Wo Pelvis 2-3 Views Right  Addendum Date: 11/11/2016   ADDENDUM REPORT: 11/11/2016 10:43 ADDENDUM: There is a subcapital femoral neck fracture on the right, best seen on lateral view. I discussed this finding with the referring physician. Electronically Signed   By: Bretta Bang III M.D.   On: 11/11/2016 10:43   Result Date: 11/11/2016 CLINICAL DATA:  Pain following fall EXAM: DG HIP (WITH OR WITHOUT PELVIS) 2-3V RIGHT COMPARISON:  None. FINDINGS: Frontal pelvis as well as frontal and lateral right hip images were obtained. Bones are osteoporotic. No acute fracture or dislocation. There is slight narrowing of each hip joint. No erosive change. Sacroiliac joints appear unremarkable. IMPRESSION: No fracture or dislocation. Slight symmetric narrowing both hip joints. Bones are osteoporotic. Electronically Signed: By: Bretta Bang III M.D. On: 11/11/2016 10:22    Positive ROS: All other systems have been reviewed  and were otherwise negative with the exception of those mentioned in the HPI and as above.  Physical Exam: General: Alert, no acute distress Cardiovascular: No pedal edema Respiratory: No cyanosis, no use of accessory musculature GI: No organomegaly, abdomen is soft and non-tender Skin: No lesions in the area of chief complaint Neurologic: Sensation intact distally Psychiatric:  Patient is competent for consent with normal mood and affect Lymphatic: No axillary or cervical lymphadenopathy  MUSCULOSKELETAL: Examination of the right hip reveals no skin wounds or lesions. He is shortened and externally rotated. He is neurovascularly intact distally.  Assessment: Displaced right femoral neck fracture  Plan: I discussed the findings with the patient and his son. I recommended right hip hemiarthroplasty to aid in pain control and to allow immediate mobilization out of bed. We discussed the risks, benefits, and alternatives.  Please see statement of risk. We will plan for surgery today.  The risks, benefits, and alternatives were discussed with the patient. There are risks associated with the surgery including, but not limited to, problems with anesthesia (death), infection, instability (giving out of the joint), dislocation, differences in leg length/angulation/rotation, fracture of bones, loosening or failure of implants, hematoma (blood accumulation) which may require surgical drainage, blood clots, pulmonary embolism, nerve injury (foot drop and lateral thigh numbness), and blood vessel injury. The patient understands these risks and elects to proceed.   Bradon Fester, Cloyde Reams, MD Cell [phone]    11/12/2016 8:44 AM

## 2016-11-12 NOTE — Anesthesia Postprocedure Evaluation (Signed)
Anesthesia Post Note  Patient: Caleb Hull  Procedure(s) Performed: Procedure(s) (LRB): ANTERIOR APPROACH HEMI HIP ARTHROPLASTY (Right)     Patient location during evaluation: PACU Anesthesia Type: General Level of consciousness: awake and alert Pain management: pain level controlled Vital Signs Assessment: post-procedure vital signs reviewed and stable Respiratory status: spontaneous breathing, nonlabored ventilation and respiratory function stable Cardiovascular status: blood pressure returned to baseline and stable Postop Assessment: no apparent nausea or vomiting Anesthetic complications: no    Last Vitals:  Vitals:   11/12/16 0653 11/12/16 1135  BP: (!) 103/56 94/65  Pulse: (!) 52 86  Resp: 16 16  Temp: 36.8 C 37.1 C  SpO2: 96% 98%    Last Pain:  Vitals:   11/12/16 1135  TempSrc:   PainSc: 0-No pain                 Beryle Lathe

## 2016-11-12 NOTE — Discharge Instructions (Signed)
°Dr. Lesleigh Hughson °Joint Replacement Specialist °[facility] °[address] °[place],  [address] °[phone] ° ° °TOTAL HIP REPLACEMENT POSTOPERATIVE DIRECTIONS ° ° ° °Hip Rehabilitation, Guidelines Following Surgery  ° °WEIGHT BEARING °Weight bearing as tolerated with assist device (walker, cane, etc) as directed, use it as long as suggested by your surgeon or therapist, typically at least 4-6 weeks. ° °The results of a hip operation are greatly improved after range of motion and muscle strengthening exercises. Follow all safety measures which are given to protect your hip. If any of these exercises cause increased pain or swelling in your joint, decrease the amount until you are comfortable again. Then slowly increase the exercises. Call your caregiver if you have problems or questions.  ° °HOME CARE INSTRUCTIONS  °Most of the following instructions are designed to prevent the dislocation of your new hip.  °Remove items at home which could result in a fall. This includes throw rugs or furniture in walking pathways.  °Continue medications as instructed at time of discharge. °· You may have some home medications which will be placed on hold until you complete the course of blood thinner medication. °· You may start showering once you are discharged home. Do not remove your dressing. °Do not put on socks or shoes without following the instructions of your caregivers.   °Sit on chairs with arms. Use the chair arms to help push yourself up when arising.  °Arrange for the use of a toilet seat elevator so you are not sitting low.  °· Walk with walker as instructed.  °You may resume a sexual relationship in one month or when given the OK by your caregiver.  °Use walker as long as suggested by your caregivers.  °You may put full weight on your legs and walk as much as is comfortable. °Avoid periods of inactivity such as sitting longer than an hour when not asleep. This helps prevent  blood clots.  °You may return to work once you are cleared by your surgeon.  °Do not drive a car for 6 weeks or until released by your surgeon.  °Do not drive while taking narcotics.  °Wear elastic stockings for two weeks following surgery during the day but you may remove then at night.  °Make sure you keep all of your appointments after your operation with all of your doctors and caregivers. You should call the office at the above phone number and make an appointment for approximately two weeks after the date of your surgery. °Please pick up a stool softener and laxative for home use as long as you are requiring pain medications. °· ICE to the affected hip every three hours for 30 minutes at a time and then as needed for pain and swelling. Continue to use ice on the hip for pain and swelling from surgery. You may notice swelling that will progress down to the foot and ankle.  This is normal after surgery.  Elevate the leg when you are not up walking on it.   °It is important for you to complete the blood thinner medication as prescribed by your doctor. °· Continue to use the breathing machine which will help keep your temperature down.  It is common for your temperature to cycle up and down following surgery, especially at night when you are not up moving around and exerting yourself.  The breathing machine keeps your lungs expanded and your temperature down. ° °RANGE OF MOTION AND STRENGTHENING EXERCISES  °These exercises are   designed to help you keep full movement of your hip joint. Follow your caregiver's or physical therapist's instructions. Perform all exercises about fifteen times, three times per day or as directed. Exercise both hips, even if you have had only one joint replacement. These exercises can be done on a training (exercise) mat, on the floor, on a table or on a bed. Use whatever works the best and is most comfortable for you. Use music or television while you are exercising so that the exercises  are a pleasant break in your day. This will make your life better with the exercises acting as a break in routine you can look forward to.  °Lying on your back, slowly slide your foot toward your buttocks, raising your knee up off the floor. Then slowly slide your foot back down until your leg is straight again.  °Lying on your back spread your legs as far apart as you can without causing discomfort.  °Lying on your side, raise your upper leg and foot straight up from the floor as far as is comfortable. Slowly lower the leg and repeat.  °Lying on your back, tighten up the muscle in the front of your thigh (quadriceps muscles). You can do this by keeping your leg straight and trying to raise your heel off the floor. This helps strengthen the largest muscle supporting your knee.  °Lying on your back, tighten up the muscles of your buttocks both with the legs straight and with the knee bent at a comfortable angle while keeping your heel on the floor.  ° °SKILLED REHAB INSTRUCTIONS: °If the patient is transferred to a skilled rehab facility following release from the hospital, a list of the current medications will be sent to the facility for the patient to continue.  When discharged from the skilled rehab facility, please have the facility set up the patient's Home Health Physical Therapy prior to being released. Also, the skilled facility will be responsible for providing the patient with their medications at time of release from the facility to include their pain medication and their blood thinner medication. If the patient is still at the rehab facility at time of the two week follow up appointment, the skilled rehab facility will also need to assist the patient in arranging follow up appointment in our office and any transportation needs. ° °MAKE SURE YOU:  °Understand these instructions.  °Will watch your condition.  °Will get help right away if you are not doing well or get worse. ° °Pick up stool softner and  laxative for home use following surgery while on pain medications. °Do not remove your dressing. °The dressing is waterproof--it is OK to take showers. °Continue to use ice for pain and swelling after surgery. °Do not use any lotions or creams on the incision until instructed by your surgeon. °Total Hip Protocol. ° ° °

## 2016-11-12 NOTE — Transfer of Care (Signed)
Immediate Anesthesia Transfer of Care Note  Patient: Caleb Hull  Procedure(s) Performed: Procedure(s): ANTERIOR APPROACH HEMI HIP ARTHROPLASTY (Right)  Patient Location: PACU  Anesthesia Type:General  Level of Consciousness: awake  Airway & Oxygen Therapy: Patient Spontanous Breathing and Patient connected to nasal cannula oxygen  Post-op Assessment: Report given to RN and Post -op Vital signs reviewed and stable  Post vital signs: Reviewed and stable  Last Vitals:  Vitals:   11/11/16 2143 11/12/16 0653  BP: (!) 112/50 (!) 103/56  Pulse: (!) 52 (!) 52  Resp: 16 16  Temp: 37.2 C 36.8 C  SpO2: 96% 96%    Last Pain:  Vitals:   11/12/16 0653  TempSrc: Oral  PainSc:          Complications: No apparent anesthesia complications

## 2016-11-12 NOTE — Anesthesia Procedure Notes (Signed)
Procedure Name: Intubation Date/Time: 11/12/2016 9:50 AM Performed by: Manus Gunning, Devaeh Amadi J Pre-anesthesia Checklist: Patient identified, Emergency Drugs available, Suction available, Patient being monitored and Timeout performed Patient Re-evaluated:Patient Re-evaluated prior to induction Oxygen Delivery Method: Circle system utilized Preoxygenation: Pre-oxygenation with 100% oxygen Induction Type: IV induction Ventilation: Mask ventilation without difficulty Laryngoscope Size: Mac and 3 Grade View: Grade I Tube type: Oral Tube size: 7.0 mm Number of attempts: 1 Airway Equipment and Method: Stylet Placement Confirmation: ETT inserted through vocal cords under direct vision,  positive ETCO2 and breath sounds checked- equal and bilateral Secured at: 21 cm Tube secured with: Tape Dental Injury: Teeth and Oropharynx as per pre-operative assessment

## 2016-11-12 NOTE — Progress Notes (Signed)
PROGRESS NOTE    Caleb Hull  AVW:[id number] DOB: September 01, 1930 DOA: 11/11/2016 PCP: Patient, No Pcp Per   Brief Narrative: 83 year old sinuses speaking male with a remote history of gastric cancer presented to the ER after a fall while playing ping-pong. X-ray showed displaced right femoral neck fracture. Evaluated by orthopedics and now is status post surgery.  Assessment & Plan:   # Subcapital fracture of femur, right, closed, initial encounter (HCC) -Status post right hip hemiarthroplasty. -Continue supportive care including pain management. -DVT prophylaxis as per orthopedics. Currently on SCD. -PT OT evaluation and treatment.  #Hypotension: Patient is symptomatic. Reported that his blood pressure runs lower at home. Does not take antihypertensive medication. Lower blood pressure today post surgery likely related with anesthesia, pain medication. Continue IV fluid. Monitor blood pressure.  DVT prophylaxis: SCD, anticoagulation defer to orthopedics Code Status: Full code Family Communication: Discussed with patient's daughter and son-in-law at bedside Disposition Plan: Currently admitted    Consultants:   Orthopedics  Procedures: Hip surgery Antimicrobials: None  Subjective: Seen and examined at bedside. Denied headache, dizziness, nausea vomiting chest pain or shortness of breath. No leg or hip pain.  Objective: Vitals:   11/12/16 1145 11/12/16 1200 11/12/16 1215 11/12/16 1248  BP: (!) 99/59 (!) 104/58 (!) 94/50 (!) 89/75  Pulse: 71 65 61 60  Resp: Temp:   98.1 F (36.7 C) (!) 97.4 F (36.3 C)  TempSrc:    Axillary  SpO2: 100% 96% 98% 96%  Weight:      Height:        Intake/Output Summary (Last 24 hours) at 11/12/16 1357 Last data filed at 11/12/16 1121  Gross per 24 hour  Intake             1240 ml  Output              450 ml  Net              790 ml   Filed Weights   11/11/16 1820 11/12/16 0850  Weight: 51.3 kg (113 lb) 51.3 kg (113 lb)     Examination:  General exam: Appears calm and comfortable  Respiratory system: Clear to auscultation. Respiratory effort normal. No wheezing or crackle Cardiovascular system: S1 & S2 heard, RRR.  No pedal edema. Gastrointestinal system: Abdomen is nondistended, soft and nontender. Normal bowel sounds heard. Central nervous system: Alert and oriented. No focal neurological deficits. Skin: No rashes, lesions or ulcers Psychiatry: Judgement and insight appear normal. Mood & affect appropriate.     Data Reviewed: I have personally reviewed following labs and imaging studies  CBC:  Recent Labs Lab 11/11/16 1356  WBC 10.9*  NEUTROABS 8.5*  HGB 13.8  HCT 40.5  MCV 89.0  PLT 194   Basic Metabolic Panel:  Recent Labs Lab 11/11/16 1356  NA 138  K 3.6  CL 103  CO2 25  GLUCOSE 119*  BUN 15  CREATININE 0.81  CALCIUM 9.0   GFR: Estimated Creatinine Clearance: 49.3 mL/min (by C-G formula based on SCr of 0.81 mg/dL). Liver Function Tests: No results for input(s): AST, ALT, ALKPHOS, BILITOT, PROT, ALBUMIN in the last 168 hours. No results for input(s): LIPASE, AMYLASE in the last 168 hours. No results for input(s): AMMONIA in the last 168 hours. Coagulation Profile:  Recent Labs Lab 11/11/16 1427  INR 1.15   Cardiac Enzymes: No results for input(s): CKTOTAL, CKMB, CKMBINDEX, TROPONINI in the last 168 hours. BNP (last 3 results)  No results for input(s): PROBNP in the last 8760 hours. HbA1C: No results for input(s): HGBA1C in the last 72 hours. CBG: No results for input(s): GLUCAP in the last 168 hours. Lipid Profile: No results for input(s): CHOL, HDL, LDLCALC, TRIG, CHOLHDL, LDLDIRECT in the last 72 hours. Thyroid Function Tests: No results for input(s): TSH, T4TOTAL, FREET4, T3FREE, THYROIDAB in the last 72 hours. Anemia Panel: No results for input(s): VITAMINB12, FOLATE, FERRITIN, TIBC, IRON, RETICCTPCT in the last 72 hours. Sepsis Labs: No results for  input(s): PROCALCITON, LATICACIDVEN in the last 168 hours.  Recent Results (from the past 240 hour(s))  Surgical pcr screen     Status: None   Collection Time: 11/12/16  7:14 AM  Result Value Ref Range Status   MRSA, PCR NEGATIVE NEGATIVE Final   Staphylococcus aureus NEGATIVE NEGATIVE Final    Comment: (NOTE) The Xpert SA Assay (FDA approved for NASAL specimens in patients 91 years of age and older), is one component of a comprehensive surveillance program. It is not intended to diagnose infection nor to guide or monitor treatment.          Radiology Studies: Dg Chest 1 View  Result Date: 11/11/2016 CLINICAL DATA:  CT fracture. EXAM: CHEST 1 VIEW COMPARISON:  No recent. FINDINGS: Mediastinum hilar structures normal. Cardiomegaly with normal pulmonary vascularity. No focal infiltrate. Mild bilateral pleural thickening/scarring versus tiny pleural effusions. No pneumothorax. Pneumothorax. IMPRESSION: 1. Tiny bilateral pleural effusions versus pleural scarring. No acute pulmonary disease noted. 2.  Cardiomegaly with normal pulmonary vascularity. Electronically Signed   By: Maisie Fus  Register   On: 11/11/2016 14:40   Pelvis Portable  Result Date: 11/12/2016 CLINICAL DATA:  Status post right hip replacement EXAM: PORTABLE PELVIS 1-2 VIEWS COMPARISON:  None. FINDINGS: Right hip prosthesis is noted in satisfactory position. No acute bony or soft tissue abnormality is seen. IMPRESSION: Status post right hip replacement Electronically Signed   By: Alcide Clever M.D.   On: 11/12/2016 12:22   Dg Knee Right Port  Result Date: 11/12/2016 CLINICAL DATA:  Closed displaced fracture of the right femoral neck. EXAM: PORTABLE RIGHT KNEE - 1-2 VIEW COMPARISON:  Right hip radiographs from earlier on the same day. FINDINGS: AP and lateral views of the right knee are provided. No joint effusion, malalignment or fracture. IMPRESSION: No fracture or malalignment of the right knee.  No joint effusion.  Electronically Signed   By: Tollie Eth M.D.   On: 11/12/2016 01:12   Dg C-arm 1-60 Min  Result Date: 11/12/2016 CLINICAL DATA:  Right hip replacement EXAM: DG C-ARM 61-120 MIN; OPERATIVE RIGHT HIP WITH PELVIS COMPARISON:  11/11/2016 FINDINGS: Changes of right hip replacement. Normal AP alignment. No visible hardware complicating feature. IMPRESSION: Right hip replacement.  No visible complicating feature. Electronically Signed   By: Charlett Nose M.D.   On: 11/12/2016 11:50   Dg Hip Operative Unilat With Pelvis Right  Result Date: 11/12/2016 CLINICAL DATA:  Right hip replacement EXAM: DG C-ARM 61-120 MIN; OPERATIVE RIGHT HIP WITH PELVIS COMPARISON:  11/11/2016 FINDINGS: Changes of right hip replacement. Normal AP alignment. No visible hardware complicating feature. IMPRESSION: Right hip replacement.  No visible complicating feature. Electronically Signed   By: Charlett Nose M.D.   On: 11/12/2016 11:50   Dg Hip Unilat W Or Wo Pelvis 2-3 Views Right  Addendum Date: 11/11/2016   ADDENDUM REPORT: 11/11/2016 10:43 ADDENDUM: There is a subcapital femoral neck fracture on the right, best seen on lateral view. I discussed this finding with  the referring physician. Electronically Signed   By: Bretta Bang III M.D.   On: 11/11/2016 10:43   Result Date: 11/11/2016 CLINICAL DATA:  Pain following fall EXAM: DG HIP (WITH OR WITHOUT PELVIS) 2-3V RIGHT COMPARISON:  None. FINDINGS: Frontal pelvis as well as frontal and lateral right hip images were obtained. Bones are osteoporotic. No acute fracture or dislocation. There is slight narrowing of each hip joint. No erosive change. Sacroiliac joints appear unremarkable. IMPRESSION: No fracture or dislocation. Slight symmetric narrowing both hip joints. Bones are osteoporotic. Electronically Signed: By: Bretta Bang III M.D. On: 11/11/2016 10:22        Scheduled Meds: Continuous Infusions: .  ceFAZolin (ANCEF) IV    . lactated ringers 10 mL/hr at 11/12/16  0851  . methocarbamol (ROBAXIN)  IV       LOS: 1 day    Dron Jaynie Collins, MD [facility] Pager [phone]  If 7PM-7AM, please contact night-coverage [email or web address] Password [email or web address] 11/12/2016, 1:57 PM

## 2016-11-12 NOTE — Progress Notes (Signed)
Orthopedic Tech Progress Note Patient Details:  Caleb Hull [birth year] [id number]  Patient ID: Caleb Hull, male   DOB: May 13, 1930, 83 y.o.   MRN: [id number] Pt doesn't qualify for overhead frame due to age.  [name] 11/12/2016, 11:19 PM

## 2016-11-13 LAB — CBC
HEMATOCRIT: 30.2 % — AB (ref 39.0–52.0)
HEMOGLOBIN: 9.8 g/dL — AB (ref 13.0–17.0)
MCH: 29.2 pg (ref 26.0–34.0)
MCHC: 32.5 g/dL (ref 30.0–36.0)
MCV: 89.9 fL (ref 78.0–100.0)
Platelets: 141 10*3/uL — ABNORMAL LOW (ref 150–400)
RBC: 3.36 MIL/uL — ABNORMAL LOW (ref 4.22–5.81)
RDW: 15.3 % (ref 11.5–15.5)
WBC: 11.6 10*3/uL — AB (ref 4.0–10.5)

## 2016-11-13 LAB — BASIC METABOLIC PANEL
ANION GAP: 6 (ref 5–15)
BUN: 10 mg/dL (ref 6–20)
CHLORIDE: 107 mmol/L (ref 101–111)
CO2: 23 mmol/L (ref 22–32)
Calcium: 7.8 mg/dL — ABNORMAL LOW (ref 8.9–10.3)
Creatinine, Ser: 0.79 mg/dL (ref 0.61–1.24)
GFR calc non Af Amer: >60 mL/min (ref >60)
Glucose, Bld: 114 mg/dL — ABNORMAL HIGH (ref 65–99)
Potassium: 3.9 mmol/L (ref 3.5–5.1)
Sodium: 136 mmol/L (ref 135–145)

## 2016-11-13 MED ORDER — ASPIRIN EC 81 MG PO TBEC
81.0000 mg | DELAYED_RELEASE_TABLET | Freq: Two times a day (BID) | ORAL | Status: DC
  Administered 2016-11-13 – 2016-11-14 (×3): 81 mg via ORAL
  Filled 2016-11-13 (×3): qty 1

## 2016-11-13 NOTE — Evaluation (Signed)
Physical Therapy Evaluation Patient Details Name: Caleb Hull MRN: [id number] DOB: 1930-02-17 Today's Date: 11/13/2016   History of Present Illness  83 year old male with a  remote history of gastric cancer, who presented to the ED after a fall while playing ping-pong. Xray revealed R hip fx. He underwent hemiarthroplasty (anterior approach).   Clinical Impression  Pt admitted with above diagnosis. Pt currently with functional limitations due to the deficits listed below (see PT Problem List). On eval pt required supervision bed mobility, min guard assist transfers, min guard hand-held assist ambulation 350 feet, and min guard assist 12 steps with R handrail. See below for further details. Pt is reporting no pain. Pt will benefit from skilled PT to increase their independence and safety with mobility to allow discharge to the venue listed below.  Family/pt educated in HEP. Handout provided. PT to follow acutely. No follow up services indicated.     Follow Up Recommendations No PT follow up;Supervision for mobility/OOB    Equipment Recommendations  None recommended by PT    Recommendations for Other Services       Precautions / Restrictions Precautions Precautions: Fall Restrictions RLE Weight Bearing: Weight bearing as tolerated      Mobility  Bed Mobility Overal bed mobility: Needs Assistance Bed Mobility: Sit to Supine;Supine to Sit     Supine to sit: Supervision Sit to supine: Supervision   General bed mobility comments: +rail, no physical assist  Transfers Overall transfer level: Needs assistance Equipment used: None Transfers: Sit to/from Stand;Stand Pivot Transfers Sit to Stand: Min guard Stand pivot transfers: Min guard       General transfer comment: good technique  Ambulation/Gait Ambulation/Gait assistance: Min guard Ambulation Distance (Feet): 350 Feet Assistive device: 1 person hand held assist Gait Pattern/deviations: Step-through pattern;Decreased  stride length Gait velocity: WFL   General Gait Details: slight bilat knee flexion during gait  Stairs Stairs: Yes Stairs assistance: Min guard Stair Management: One rail Right;Alternating pattern Number of Stairs: 12    Wheelchair Mobility    Modified Rankin (Stroke Patients Only)       Balance Overall balance assessment: Needs assistance;History of Falls Sitting-balance support: No upper extremity supported;Feet supported Sitting balance-Leahy Scale: Good     Standing balance support: No upper extremity supported;During functional activity Standing balance-Leahy Scale: Good                               Pertinent Vitals/Pain Pain Assessment: No/denies pain    Home Living Family/patient expects to be discharged to:: Private residence Living Arrangements: Children;Other relatives (Lives in [place] with spouse and son. Currently visiting son and his family in [place]. ) Available Help at Discharge: Family;Available 24 hours/day Type of Home: House Home Access: Level entry     Home Layout: Two level Home Equipment: Shower seat Additional Comments: Pt. family was educated on toilet riser if needed. Pt. is mobilizing well and may not need it.     Prior Function Level of Independence: Independent               Hand Dominance   Dominant Hand: Right    Extremity/Trunk Assessment   Upper Extremity Assessment Upper Extremity Assessment: Defer to OT evaluation    Lower Extremity Assessment Lower Extremity Assessment: Overall WFL for tasks assessed    Cervical / Trunk Assessment Cervical / Trunk Assessment: Normal  Communication   Communication: Interpreter utilized (daughter in Social worker and granddaughter)  Cognition Arousal/Alertness:  Awake/alert Behavior During Therapy: WFL for tasks assessed/performed Overall Cognitive Status: Within Functional Limits for tasks assessed                                        General Comments       Exercises General Exercises - Lower Extremity Ankle Circles/Pumps: AROM;Both;10 reps Quad Sets: AROM;Both;10 reps Short Arc Quad: AROM;Right;10 reps Heel Slides: AROM;Right;10 reps Hip ABduction/ADduction: AROM;Right;10 reps   Assessment/Plan    PT Assessment Patient needs continued PT services  PT Problem List Decreased activity tolerance;Decreased balance;Decreased mobility;Decreased knowledge of use of DME;Decreased knowledge of precautions       PT Treatment Interventions DME instruction;Gait training;Stair training;Functional mobility training;Balance training;Therapeutic exercise;Therapeutic activities;Patient/family education    PT Goals (Current goals can be found in the Care Plan section)  Acute Rehab PT Goals Patient Stated Goal: home PT Goal Formulation: With patient/family Time For Goal Achievement: 11/20/16 Potential to Achieve Goals: Good    Frequency Min 5X/week   Barriers to discharge        Co-evaluation               AM-PAC PT "6 Clicks" Daily Activity  Outcome Measure Difficulty turning over in bed (including adjusting bedclothes, sheets and blankets)?: None Difficulty moving from lying on back to sitting on the side of the bed? : A Little Difficulty sitting down on and standing up from a chair with arms (e.g., wheelchair, bedside commode, etc,.)?: A Little Help needed moving to and from a bed to chair (including a wheelchair)?: None Help needed walking in hospital room?: None Help needed climbing 3-5 steps with a railing? : A Little 6 Click Score: 21    End of Session Equipment Utilized During Treatment: Gait belt Activity Tolerance: Patient tolerated treatment well Patient left: in chair;with call bell/phone within reach;with family/visitor present Nurse Communication: Mobility status PT Visit Diagnosis: Difficulty in walking, not elsewhere classified (R26.2)    Time: 1610-9604 PT Time Calculation (min) (ACUTE ONLY): 32 min   Charges:    PT Evaluation $PT Eval Low Complexity: 1 Low PT Treatments $Gait Training: 8-22 mins   PT G Codes:        Aida Raider, PT  Office # [phone] Pager [phone]   Ilda Foil 11/13/2016, 12:58 PM

## 2016-11-13 NOTE — Progress Notes (Signed)
PROGRESS NOTE    Caleb Hull  ZOX:[id number] DOB: 1931/02/09 DOA: 11/11/2016 PCP: Patient, No Pcp Per   Brief Narrative: 83 year old sinuses speaking male with a remote history of gastric cancer presented to the ER after a fall while playing ping-pong. X-ray showed displaced right femoral neck fracture. Evaluated by orthopedics and now is status post surgery.  Assessment & Plan:   # Subcapital fracture of femur, right, closed, initial encounter (HCC) -Status post right hip hemiarthroplasty. -Continue supportive care including pain management. -DVT prophylaxis ASA bid per Ortho. -PT OT evaluation and treatment today. Pain is controlled.  #Hypotension: Patient is asymptomatic. Patient and family reported that he has low blood pressure on the left upper extremity. Patient has currently IV access on right upper extremity. I discussed with the nursing staff to check blood pressure on the right side. May start IV access on the left side as needed. I'll discontinue IV fluid. Pain is controlled. Continue to monitor blood pressure.  DVT prophylaxis: SCD, aspirin twice a day Code Status: Full code Family Communication: Discussed with patient's daughter and granddaughter at bedside Disposition Plan: Currently admitted, likely discharge home tomorrow    Consultants:   Orthopedics  Procedures: Hip surgery Antimicrobials: None  Subjective: Seen and examined at bedside. Denied headache, dizziness, nausea vomiting chest pressure shortness of breath. No hip pain. Objective: Vitals:   11/12/16 1731 11/12/16 2007 11/12/16 2153 11/13/16 0352  BP: (!) 89/46 (!) 79/48 (!) 82/47 (!) 86/51  Pulse: 61 64 (!) 57 70  Resp:      Temp: 97.8 F (36.6 C) 97.8 F (36.6 C)  98.1 F (36.7 C)  TempSrc: Oral Oral  Oral  SpO2: 98% 97%  98%  Weight:      Height:        Intake/Output Summary (Last 24 hours) at 11/13/16 1212 Last data filed at 11/12/16 1850  Gross per 24 hour  Intake            166.5 ml   Output              305 ml  Net           -138.5 ml   Filed Weights   11/11/16 1820 11/12/16 0850  Weight: 51.3 kg (113 lb) 51.3 kg (113 lb)    Examination:  General exam: Pleasant male lying in bed comfortable, not in distress  Respiratory system: Clear bilaterally, respiratory effort normal. No wheezing or crackle Cardiovascular system: Regular rate and rhythm, S1-S2 normal. Gastrointestinal system: Abdomen is nondistended, soft and nontender. Normal bowel sounds heard. Central nervous system: Alert and oriented. No focal neurological deficits. Skin: No rashes, lesions or ulcers Psychiatry: Judgement and insight appear normal. Mood & affect appropriate.     Data Reviewed: I have personally reviewed following labs and imaging studies  CBC:  Recent Labs Lab 11/11/16 1356 11/13/16 0645  WBC 10.9* 11.6*  NEUTROABS 8.5*  --   HGB 13.8 9.8*  HCT 40.5 30.2*  MCV 89.0 89.9  PLT 194 141*   Basic Metabolic Panel:  Recent Labs Lab 11/11/16 1356 11/13/16 0645  NA 138 136  K 3.6 3.9  CL 103 107  CO2 25 23  GLUCOSE 119* 114*  BUN 15 10  CREATININE 0.81 0.79  CALCIUM 9.0 7.8*   GFR: Estimated Creatinine Clearance: 49.9 mL/min (by C-G formula based on SCr of 0.79 mg/dL). Liver Function Tests: No results for input(s): AST, ALT, ALKPHOS, BILITOT, PROT, ALBUMIN in the last 168 hours. No results for  input(s): LIPASE, AMYLASE in the last 168 hours. No results for input(s): AMMONIA in the last 168 hours. Coagulation Profile:  Recent Labs Lab 11/11/16 1427  INR 1.15   Cardiac Enzymes: No results for input(s): CKTOTAL, CKMB, CKMBINDEX, TROPONINI in the last 168 hours. BNP (last 3 results) No results for input(s): PROBNP in the last 8760 hours. HbA1C: No results for input(s): HGBA1C in the last 72 hours. CBG: No results for input(s): GLUCAP in the last 168 hours. Lipid Profile: No results for input(s): CHOL, HDL, LDLCALC, TRIG, CHOLHDL, LDLDIRECT in the last 72  hours. Thyroid Function Tests: No results for input(s): TSH, T4TOTAL, FREET4, T3FREE, THYROIDAB in the last 72 hours. Anemia Panel: No results for input(s): VITAMINB12, FOLATE, FERRITIN, TIBC, IRON, RETICCTPCT in the last 72 hours. Sepsis Labs: No results for input(s): PROCALCITON, LATICACIDVEN in the last 168 hours.  Recent Results (from the past 240 hour(s))  Surgical pcr screen     Status: None   Collection Time: 11/12/16  7:14 AM  Result Value Ref Range Status   MRSA, PCR NEGATIVE NEGATIVE Final   Staphylococcus aureus NEGATIVE NEGATIVE Final    Comment: (NOTE) The Xpert SA Assay (FDA approved for NASAL specimens in patients 32 years of age and older), is one component of a comprehensive surveillance program. It is not intended to diagnose infection nor to guide or monitor treatment.          Radiology Studies: Dg Chest 1 View  Result Date: 11/11/2016 CLINICAL DATA:  CT fracture. EXAM: CHEST 1 VIEW COMPARISON:  No recent. FINDINGS: Mediastinum hilar structures normal. Cardiomegaly with normal pulmonary vascularity. No focal infiltrate. Mild bilateral pleural thickening/scarring versus tiny pleural effusions. No pneumothorax. Pneumothorax. IMPRESSION: 1. Tiny bilateral pleural effusions versus pleural scarring. No acute pulmonary disease noted. 2.  Cardiomegaly with normal pulmonary vascularity. Electronically Signed   By: Maisie Fus  Register   On: 11/11/2016 14:40   Pelvis Portable  Result Date: 11/12/2016 CLINICAL DATA:  Status post right hip replacement EXAM: PORTABLE PELVIS 1-2 VIEWS COMPARISON:  None. FINDINGS: Right hip prosthesis is noted in satisfactory position. No acute bony or soft tissue abnormality is seen. IMPRESSION: Status post right hip replacement Electronically Signed   By: Alcide Clever M.D.   On: 11/12/2016 12:22   Dg Knee Right Port  Result Date: 11/12/2016 CLINICAL DATA:  Closed displaced fracture of the right femoral neck. EXAM: PORTABLE RIGHT KNEE - 1-2  VIEW COMPARISON:  Right hip radiographs from earlier on the same day. FINDINGS: AP and lateral views of the right knee are provided. No joint effusion, malalignment or fracture. IMPRESSION: No fracture or malalignment of the right knee.  No joint effusion. Electronically Signed   By: Tollie Eth M.D.   On: 11/12/2016 01:12   Dg C-arm 1-60 Min  Result Date: 11/12/2016 CLINICAL DATA:  Right hip replacement EXAM: DG C-ARM 61-120 MIN; OPERATIVE RIGHT HIP WITH PELVIS COMPARISON:  11/11/2016 FINDINGS: Changes of right hip replacement. Normal AP alignment. No visible hardware complicating feature. IMPRESSION: Right hip replacement.  No visible complicating feature. Electronically Signed   By: Charlett Nose M.D.   On: 11/12/2016 11:50   Dg Hip Operative Unilat With Pelvis Right  Result Date: 11/12/2016 CLINICAL DATA:  Right hip replacement EXAM: DG C-ARM 61-120 MIN; OPERATIVE RIGHT HIP WITH PELVIS COMPARISON:  11/11/2016 FINDINGS: Changes of right hip replacement. Normal AP alignment. No visible hardware complicating feature. IMPRESSION: Right hip replacement.  No visible complicating feature. Electronically Signed   By: Caryn Bee  [place]over M.D.   On: 11/12/2016 11:50        Scheduled Meds: Continuous Infusions: . lactated ringers 10 mL/hr at 11/12/16 0851  . methocarbamol (ROBAXIN)  IV       LOS: 2 days    Dron Jaynie Collins, MD [facility] Pager [phone]  If 7PM-7AM, please contact night-coverage [email or web address] Password [email or web address] 11/13/2016, 12:12 PM

## 2016-11-13 NOTE — Progress Notes (Signed)
   Subjective:  Patient reports pain as mild.  Doing well.  Has been up with PT.  Denies cp/sob or n/v.  Objective:   VITALS:   Vitals:   11/12/16 1731 11/12/16 2007 11/12/16 2153 11/13/16 0352  BP: (!) 89/46 (!) 79/48 (!) 82/47 (!) 86/51  Pulse: 61 64 (!) 57 70  Resp:      Temp: 97.8 F (36.6 C) 97.8 F (36.6 C)  98.1 F (36.7 C)  TempSrc: Oral Oral  Oral  SpO2: 98% 97%  98%  Weight:      Height:       RLE:  Neurologically intact Neurovascular intact Sensation intact distally Dorsiflexion/Plantar flexion intact Incision: dressing C/D/I No cellulitis present Compartment soft   Lab Results  Component Value Date   WBC 11.6 (H) 11/13/2016   HGB 9.8 (L) 11/13/2016   HCT 30.2 (L) 11/13/2016   MCV 89.9 11/13/2016   PLT 141 (L) 11/13/2016   BMET    Component Value Date/Time   NA 136 11/13/2016 0645   K 3.9 11/13/2016 0645   CL 107 11/13/2016 0645   CO2 23 11/13/2016 0645   GLUCOSE 114 (H) 11/13/2016 0645   BUN 10 11/13/2016 0645   CREATININE 0.79 11/13/2016 0645   CALCIUM 7.8 (L) 11/13/2016 0645   GFRNONAA >60 11/13/2016 0645   GFRAA >60 11/13/2016 0645     Assessment/Plan: 1 Day Post-Op   Principal Problem:   Subcapital fracture of femur, right, closed, initial encounter (HCC) Active Problems:   Hip fracture (HCC)   Closed displaced fracture of right femoral neck (HCC)   Hypotension   Up with therapy WBAT RLE Asa bid for dvt ppx Pain well controlled at this time   Caleb Hull 11/13/2016, 9:07 AM   Caleb Rued, MD [phone]

## 2016-11-13 NOTE — Clinical Social Work Note (Signed)
CSW acknowledges SNF consult. Per CSW handoff, patient's son is not interested in HHPT or SNF and plans on him returning to [place] once stable.  CSW signing off. Consult again if any social work needs arise.  Charlynn Court, CSW [phone]

## 2016-11-13 NOTE — Evaluation (Signed)
Occupational Therapy Evaluation Patient Details Name: Caleb Hull MRN: [id number] DOB: Feb 25, 1930 Today's Date: 11/13/2016    History of Present Illness R THR ANTERIOR,. PNT REMOTE HX OF GASTRIC CANCER.   Clinical Impression   PATIENT IS S LEVEL FOR ADLS AND MOBILITY. PATIENT FAMILY CAN ASSIST AT THIS LEVEL. PATIENT IS NOT INTERESTED IN FURTHER OT. DISCUSSED WITH PATIENT FAMILY THAT IF HE HAS DIFFICULTY WITH PERFORMING SIT TO STAND FROM STANDARD COMMODE THEY CAN PURCHASE TOILET RISER. PATIENT FAMILY WAS EDUCATED ON INSTALLING GRAB BAR IN SHOWER AND TO ASSIST WITH SHOWERS INITALLY.    Follow Up Recommendations  No OT follow up    Equipment Recommendations  None recommended by OT    Recommendations for Other Services       Precautions / Restrictions Precautions Precautions: Anterior Hip Restrictions RLE Weight Bearing: Weight bearing as tolerated      Mobility Bed Mobility Overal bed mobility: Needs Assistance Bed Mobility: Sit to Supine       Sit to supine: Supervision      Transfers                      Balance                                           ADL either performed or assessed with clinical judgement   ADL Overall ADL's : Needs assistance/impaired Eating/Feeding: Independent   Grooming: Wash/dry hands;Wash/dry face;Supervision/safety   Upper Body Bathing: Set up;Sitting   Lower Body Bathing: Supervison/ safety;Sit to/from stand   Upper Body Dressing : Set up;Sitting   Lower Body Dressing: Supervision/safety;Set up;Sit to/from stand   Toilet Transfer: Supervision/safety;Set up;Ambulation   Toileting- Clothing Manipulation and Hygiene: Supervision/safety;Sit to/from stand   Tub/ Shower Transfer: Supervision/safety;Set up;Ambulation;Shower seat   Functional mobility during ADLs: Supervision/safety General ADL Comments: PATIENT IS S LEVEL AND PNT FAMILY CAN PROVIDE. FAMILY DOES NOT WANT HOME HEALTH.      Vision  Baseline Vision/History: No visual deficits       Perception     Praxis      Pertinent Vitals/Pain Pain Assessment: No/denies pain     Hand Dominance Right   Extremity/Trunk Assessment Upper Extremity Assessment Upper Extremity Assessment: Overall WFL for tasks assessed           Communication Communication Communication: Interpreter utilized   Cognition Arousal/Alertness: Awake/alert Behavior During Therapy: WFL for tasks assessed/performed Overall Cognitive Status: Within Functional Limits for tasks assessed                                     General Comments       Exercises     Shoulder Instructions      Home Living Family/patient expects to be discharged to:: Private residence Living Arrangements: Children;Other relatives ("lives in [place] w/spouse & son; visiting [place] at present)                 Bathroom Shower/Tub: Walk-in Human resources officer: Standard     Home Equipment: Information systems manager   Additional Comments: Pt. family was educated on toilet riser if needed. Pt. is mobilizing well and may not need it.       Prior Functioning/Environment Level of Independence: Independent  OT Problem List:        OT Treatment/Interventions:      OT Goals(Current goals can be found in the care plan section) Acute Rehab OT Goals Patient Stated Goal: GO HOME  OT Frequency:     Barriers to D/C:            Co-evaluation              AM-PAC PT "6 Clicks" Daily Activity     Outcome Measure Help from another person eating meals?: None Help from another person taking care of personal grooming?: A Little Help from another person toileting, which includes using toliet, bedpan, or urinal?: A Little Help from another person bathing (including washing, rinsing, drying)?: A Little Help from another person to put on and taking off regular upper body clothing?: A Little Help from another person to put on and taking off  regular lower body clothing?: A Little 6 Click Score: 19   End of Session Equipment Utilized During Treatment: Gait belt;Rolling walker Nurse Communication:  (OK THERAPY)  Activity Tolerance: Patient tolerated treatment well Patient left: in bed;with call bell/phone within reach;with family/visitor present                   Time: 9604-5409 OT Time Calculation (min): 29 min Charges:  OT General Charges $OT Visit: 1 Visit OT Evaluation $OT Eval Low Complexity: 1 Low OT Treatments $Self Care/Home Management : 8-22 mins G-Codes:     6 CLICKS.   Caleb Hull 11/13/2016, 12:03 PM

## 2016-11-14 ENCOUNTER — Encounter (HOSPITAL_COMMUNITY): Payer: Self-pay | Admitting: Orthopedic Surgery

## 2016-11-14 LAB — CBC
HEMATOCRIT: 28.3 % — AB (ref 39.0–52.0)
HEMOGLOBIN: 9.1 g/dL — AB (ref 13.0–17.0)
MCH: 29.4 pg (ref 26.0–34.0)
MCHC: 32.2 g/dL (ref 30.0–36.0)
MCV: 91.3 fL (ref 78.0–100.0)
Platelets: 151 10*3/uL (ref 150–400)
RBC: 3.1 MIL/uL — ABNORMAL LOW (ref 4.22–5.81)
RDW: 15.6 % — AB (ref 11.5–15.5)
WBC: 7 10*3/uL (ref 4.0–10.5)

## 2016-11-14 MED ORDER — ACETAMINOPHEN 325 MG PO TABS: 650.0000 mg | ORAL_TABLET | Freq: Four times a day (QID) | ORAL | Status: AC | PRN

## 2016-11-14 MED ORDER — ASPIRIN 81 MG PO TBEC: 81.0000 mg | DELAYED_RELEASE_TABLET | Freq: Two times a day (BID) | ORAL | 0 refills | Status: AC

## 2016-11-14 NOTE — Discharge Summary (Signed)
Physician Discharge Summary  Caleb Hull ZOX:[id number] DOB: 01/03/1931 DOA: 11/11/2016  PCP: Patient, No Pcp Per  Admit date: 11/11/2016 Discharge date: 11/14/2016  Admitted From:Home Disposition:Home  Recommendations for Outpatient Follow-up:  1. Follow up with PCP and Orthopedics in 1-2 weeks  Home Health:no Equipment/Devices:cane Discharge Condition:stable CODE STATUS:full code Diet recommendation:regular  Brief/Interim Summary: 83 year old [place] speaking male with a remote history of gastric cancer presented to the ER after a fall while playing ping-pong. X-ray showed displaced right femoral neck fracture. Evaluated by orthopedics and now is status post surgery.  # Subcapital fracture of femur, right, closed, initial encounter (HCC) -Status post right hip hemiarthroplasty. -DVT prophylaxis ASA bid per Ortho. -Evaluated by PT OT. Patient is able to ambulate in the hallway and his stairs without pain or difficulties. Discussed with the physical therapy. Patient will be discharged home with family members with outpatient follow-up. I have discussed with the family members and patient at bedside.  #Hypotension: Patient is asymptomatic. Patient and family reported that he has low blood pressure on the left upper extremity. Blood pressure better on the right upper extremity. Recommended to follow up with PCP. Patient is clinically stable. Discharge Diagnoses:  Principal Problem:   Subcapital fracture of femur, right, closed, initial encounter [facility]) Active Problems:   Hip fracture (HCC)   Closed displaced fracture of right femoral neck (HCC)   Hypotension    Discharge Instructions  Discharge Instructions    Call MD for:  difficulty breathing, headache or visual disturbances    Complete by:  As directed    Call MD for:  extreme fatigue    Complete by:  As directed    Call MD for:  hives    Complete by:  As directed    Call MD for:  persistant dizziness or light-headedness     Complete by:  As directed    Call MD for:  persistant nausea and vomiting    Complete by:  As directed    Call MD for:  redness, tenderness, or signs of infection (pain, swelling, redness, odor or green/yellow discharge around incision site)    Complete by:  As directed    Call MD for:  severe uncontrolled pain    Complete by:  As directed    Call MD for:  temperature >100.4    Complete by:  As directed    Diet general    Complete by:  As directed    Increase activity slowly    Complete by:  As directed      Allergies as of 11/14/2016   No Known Allergies     Medication List    TAKE these medications   acetaminophen 325 MG tablet Commonly known as:  TYLENOL Take 2 tablets (650 mg total) by mouth every 6 (six) hours as needed for mild pain (or Fever >/= 101).   aspirin 81 MG EC tablet Take 1 tablet (81 mg total) by mouth 2 (two) times daily.   ibuprofen 200 MG tablet Commonly known as:  ADVIL,MOTRIN Take 200 mg by mouth every 6 (six) hours as needed for fever, headache, mild pain, moderate pain or cramping.            Discharge Care Instructions        Start     Ordered   11/14/16 0000  acetaminophen (TYLENOL) 325 MG tablet  Every 6 hours PRN     11/14/16 1100   11/14/16 0000  aspirin EC 81 MG EC tablet  2  times daily     11/14/16 1100   11/14/16 0000  Increase activity slowly     11/14/16 1100   11/14/16 0000  Diet general     11/14/16 1100   11/14/16 0000  Call MD for:  temperature >100.4     11/14/16 1100   11/14/16 0000  Call MD for:  persistant nausea and vomiting     11/14/16 1100   11/14/16 0000  Call MD for:  severe uncontrolled pain     11/14/16 1100   11/14/16 0000  Call MD for:  difficulty breathing, headache or visual disturbances     11/14/16 1100   11/14/16 0000  Call MD for:  hives     11/14/16 1100   11/14/16 0000  Call MD for:  redness, tenderness, or signs of infection (pain, swelling, redness, odor or green/yellow discharge around  incision site)     11/14/16 1100   11/14/16 0000  Call MD for:  persistant dizziness or light-headedness     11/14/16 1100   11/14/16 0000  Call MD for:  extreme fatigue     11/14/16 1100     Follow-up Information    [place], Arlys John, MD. Schedule an appointment as soon as possible for a visit in 2 week(s).   Specialty:  Orthopedic Surgery Why:  For wound re-check Contact information: [address] [place] [address] [phone]          No Known Allergies  Consultations: Orthopedics  Procedures/Studies: Ortho surgery  Subjective: Seen and examined at bedside. Denied headache, dizziness, nausea vomiting chest pain shortness of breath. Able to ambulate without difficulties. No pain. Family members at bedside.  Discharge Exam: Vitals:   11/13/16 1900 11/14/16 0300  BP: (!) 140/54 (!) 103/44  Pulse: 71 (!) 55  Resp: 18 18  Temp: 98.1 F (36.7 C) 98.2 F (36.8 C)  SpO2: 100% 98%   Vitals:   11/13/16 0352 11/13/16 1337 11/13/16 1900 11/14/16 0300  BP: (!) 86/51 (!) 116/41 (!) 140/54 (!) 103/44  Pulse: 70 84 71 (!) 55  Resp:  Temp: 98.1 F (36.7 C) 98.4 F (36.9 C) 98.1 F (36.7 C) 98.2 F (36.8 C)  TempSrc: Oral Oral Oral Oral  SpO2: 98% 98% 100% 98%  Weight:      Height:        General: Pt is alert, awake, not in acute distress Cardiovascular: RRR, S1/S2 +, no rubs, no gallops Respiratory: CTA bilaterally, no wheezing, no rhonchi Abdominal: Soft, NT, ND, bowel sounds + Extremities: no edema, no cyanosis    The results of significant diagnostics from this hospitalization (including imaging, microbiology, ancillary and laboratory) are listed below for reference.     Microbiology: Recent Results (from the past 240 hour(s))  Surgical pcr screen     Status: None   Collection Time: 11/12/16  7:14 AM  Result Value Ref Range Status   MRSA, PCR NEGATIVE NEGATIVE Final   Staphylococcus aureus NEGATIVE NEGATIVE Final     Comment: (NOTE) The Xpert SA Assay (FDA approved for NASAL specimens in patients 33 years of age and older), is one component of a comprehensive surveillance program. It is not intended to diagnose infection nor to guide or monitor treatment.      Labs: BNP (last 3 results) No results for input(s): BNP in the last 8760 hours. Basic Metabolic Panel:  Recent Labs Lab 11/11/16 1356 11/13/16 0645  NA 138 136  K 3.6 3.9  CL 103 107  CO2 25 23  GLUCOSE 119* 114*  BUN 15 10  CREATININE 0.81 0.79  CALCIUM 9.0 7.8*   Liver Function Tests: No results for input(s): AST, ALT, ALKPHOS, BILITOT, PROT, ALBUMIN in the last 168 hours. No results for input(s): LIPASE, AMYLASE in the last 168 hours. No results for input(s): AMMONIA in the last 168 hours. CBC:  Recent Labs Lab 11/11/16 1356 11/13/16 0645 11/14/16 0427  WBC 10.9* 11.6* 7.0  NEUTROABS 8.5*  --   --   HGB 13.8 9.8* 9.1*  HCT 40.5 30.2* 28.3*  MCV 89.0 89.9 91.3  PLT 194 141* 151   Cardiac Enzymes: No results for input(s): CKTOTAL, CKMB, CKMBINDEX, TROPONINI in the last 168 hours. BNP: Invalid input(s): POCBNP CBG: No results for input(s): GLUCAP in the last 168 hours. D-Dimer No results for input(s): DDIMER in the last 72 hours. Hgb A1c No results for input(s): HGBA1C in the last 72 hours. Lipid Profile No results for input(s): CHOL, HDL, LDLCALC, TRIG, CHOLHDL, LDLDIRECT in the last 72 hours. Thyroid function studies No results for input(s): TSH, T4TOTAL, T3FREE, THYROIDAB in the last 72 hours.  Invalid input(s): FREET3 Anemia work up No results for input(s): VITAMINB12, FOLATE, FERRITIN, TIBC, IRON, RETICCTPCT in the last 72 hours. Urinalysis No results found for: COLORURINE, APPEARANCEUR, LABSPEC, PHURINE, GLUCOSEU, HGBUR, BILIRUBINUR, KETONESUR, PROTEINUR, UROBILINOGEN, NITRITE, LEUKOCYTESUR Sepsis Labs Invalid input(s): PROCALCITONIN,  WBC,  LACTICIDVEN Microbiology Recent Results (from the past 240  hour(s))  Surgical pcr screen     Status: None   Collection Time: 11/12/16  7:14 AM  Result Value Ref Range Status   MRSA, PCR NEGATIVE NEGATIVE Final   Staphylococcus aureus NEGATIVE NEGATIVE Final    Comment: (NOTE) The Xpert SA Assay (FDA approved for NASAL specimens in patients 30 years of age and older), is one component of a comprehensive surveillance program. It is not intended to diagnose infection nor to guide or monitor treatment.      Time coordinating discharge: 28 minutes  SIGNED:   Maxie Barb, MD  [facility] 11/14/2016, 11:02 AM  If 7PM-7AM, please contact night-coverage [email or web address] Password [email or web address]

## 2016-11-14 NOTE — Progress Notes (Signed)
Patient ID: Ardith Dark, male   DOB: 10-26-1930, 83 y.o.   MRN: [id number] Subjective: 2 Days Post-Op Procedure(s) (LRB): ANTERIOR APPROACH HEMI HIP ARTHROPLASTY (Right)    Patient reports pain as mild. Family in room with him reporting that he has done well with walking already.  They want to take him home today  Objective:   VITALS:   Vitals:   11/13/16 1900 11/14/16 0300  BP: (!) 140/54 (!) 103/44  Pulse: 71 (!) 55  Resp: 18 18  Temp: 98.1 F (36.7 C) 98.2 F (36.8 C)  SpO2: 100% 98%    Neurovascular intact Incision: dressing C/D/I  LABS  Recent Labs  11/11/16 1356 11/13/16 0645 11/14/16 0427  HGB 13.8 9.8* 9.1*  HCT 40.5 30.2* 28.3*  WBC 10.9* 11.6* 7.0  PLT 194 141* 151     Recent Labs  11/11/16 1356 11/13/16 0645  NA 138 136  K 3.6 3.9  BUN 15 10  CREATININE 0.81 0.79  GLUCOSE 119* 114*     Recent Labs  11/11/16 1427  INR 1.15     Assessment/Plan: 2 Days Post-Op Procedure(s) (LRB): ANTERIOR APPROACH HEMI HIP ARTHROPLASTY (Right)   Up with therapy  Likely discharge today to home Follow up with [place] in 2 weeks

## 2016-11-14 NOTE — Progress Notes (Signed)
Physical Therapy Treatment Patient Details Name: Caleb Hull MRN: [id number] DOB: 06/14/1930 Today's Date: 11/14/2016    History of Present Illness 83 year old male with a  remote history of gastric cancer, who presented to the ED after a fall while playing ping-pong. Xray revealed R hip fx. He underwent hemiarthroplasty (anterior approach).     PT Comments    Pt to d/c home today. He is at supervision level for all functional mobility. Gilmer Mor utilized for ambulation today. Pt reports it is helpful. HEP reviewed with family.    Follow Up Recommendations  No PT follow up;Supervision for mobility/OOB     Equipment Recommendations  Cane (Family to obtain on their own.)    Recommendations for Other Services       Precautions / Restrictions Precautions Precautions: Fall Restrictions RLE Weight Bearing: Weight bearing as tolerated    Mobility  Bed Mobility               General bed mobility comments: Pt sitting in recliner.  Transfers   Equipment used: Ambulation equipment used   Sit to Stand: Supervision Stand pivot transfers: Supervision       General transfer comment: good technique  Ambulation/Gait Ambulation/Gait assistance: Supervision;Min guard Ambulation Distance (Feet): 500 Feet Assistive device: Straight cane Gait Pattern/deviations: Step-through pattern;Decreased stride length Gait velocity: WFL Gait velocity interpretation: Below normal speed for age/gender General Gait Details: slight bilat knee flexion during gait   Stairs            Wheelchair Mobility    Modified Rankin (Stroke Patients Only)       Balance   Sitting-balance support: No upper extremity supported;Feet supported Sitting balance-Leahy Scale: Good     Standing balance support: Single extremity supported;During functional activity Standing balance-Leahy Scale: Good                              Cognition Arousal/Alertness: Awake/alert Behavior During  Therapy: WFL for tasks assessed/performed Overall Cognitive Status: Within Functional Limits for tasks assessed                                        Exercises General Exercises - Lower Extremity Ankle Circles/Pumps: AROM;Both;10 reps Quad Sets: AROM;Both;10 reps Short Arc Quad: AROM;Right;10 reps Long Arc Quad: AROM;Both;10 reps Heel Slides: AROM;Right;10 reps Hip ABduction/ADduction: AROM;Right;10 reps    General Comments        Pertinent Vitals/Pain Pain Assessment: Faces Faces Pain Scale: Hurts a little bit Pain Location: R hip with exercises Pain Descriptors / Indicators: Sore;Grimacing Pain Intervention(s): Monitored during session    Home Living                      Prior Function            PT Goals (current goals can now be found in the care plan section) Acute Rehab PT Goals Patient Stated Goal: home PT Goal Formulation: With patient/family Time For Goal Achievement: 11/20/16 Potential to Achieve Goals: Good Progress towards PT goals: Progressing toward goals    Frequency    Min 5X/week      PT Plan Equipment recommendations need to be updated    Co-evaluation              AM-PAC PT "6 Clicks" Daily Activity  Outcome Measure  Difficulty turning over in bed (  including adjusting bedclothes, sheets and blankets)?: None Difficulty moving from lying on back to sitting on the side of the bed? : None Difficulty sitting down on and standing up from a chair with arms (e.g., wheelchair, bedside commode, etc,.)?: A Little Help needed moving to and from a bed to chair (including a wheelchair)?: None Help needed walking in hospital room?: None Help needed climbing 3-5 steps with a railing? : A Little 6 Click Score: 22    End of Session Equipment Utilized During Treatment: Gait belt Activity Tolerance: Patient tolerated treatment well Patient left: in chair;with call bell/phone within reach;with family/visitor present Nurse  Communication: Mobility status PT Visit Diagnosis: Difficulty in walking, not elsewhere classified (R26.2)     Time: 1000-1017 PT Time Calculation (min) (ACUTE ONLY): 17 min  Charges:  $Gait Training: 8-22 mins                    G Codes:       Aida Raider, PT  Office # [phone] Pager [phone]    Ilda Foil 11/14/2016, 11:10 AM

## 2019-06-21 IMAGING — RF DG HIP (WITH PELVIS) OPERATIVE*R*
1 series · 2 of 2 positions shown · non-contrast
Comparison: 11/11/2016

CLINICAL DATA: Right hip replacement

EXAM:
DG C-ARM 61-120 MIN; OPERATIVE RIGHT HIP WITH PELVIS

[Series 1: run · 2 of 2 slices shown]
[im 1/2]
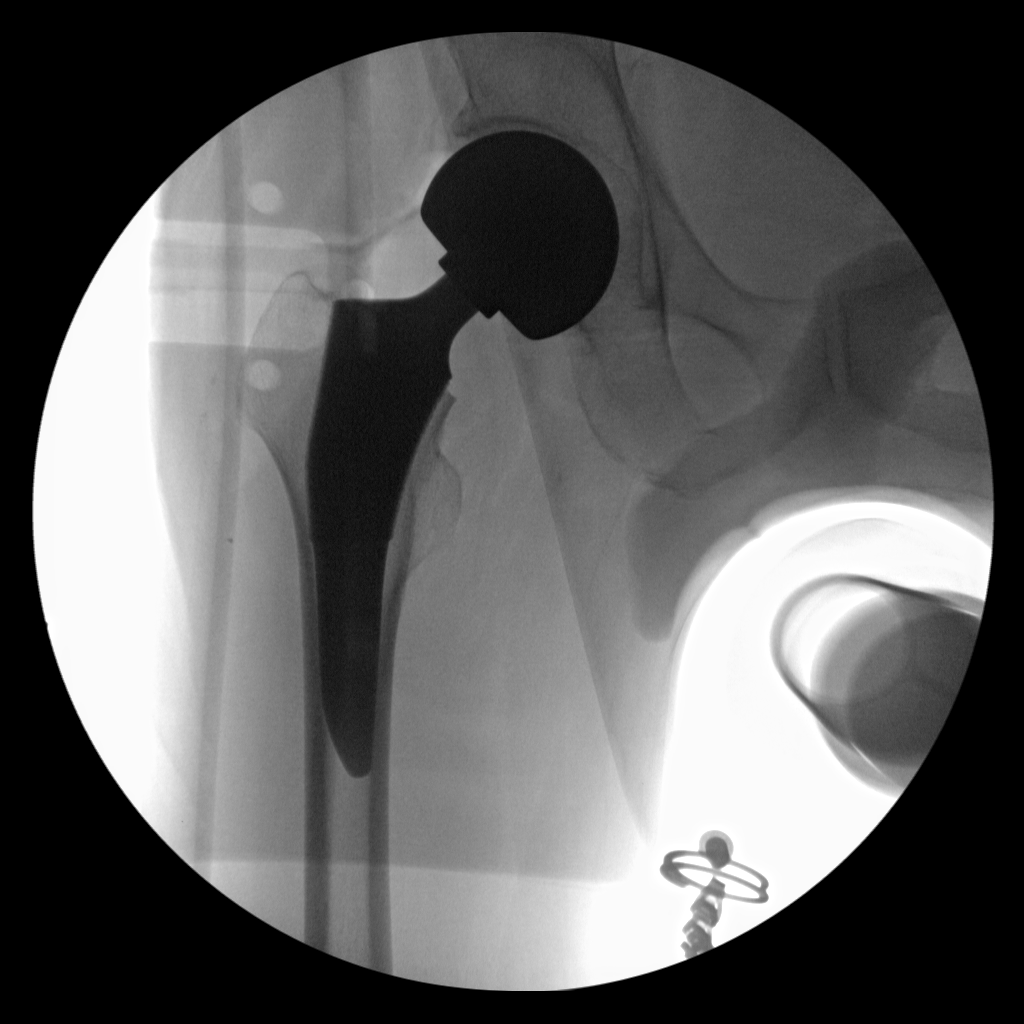
[im 2/2]
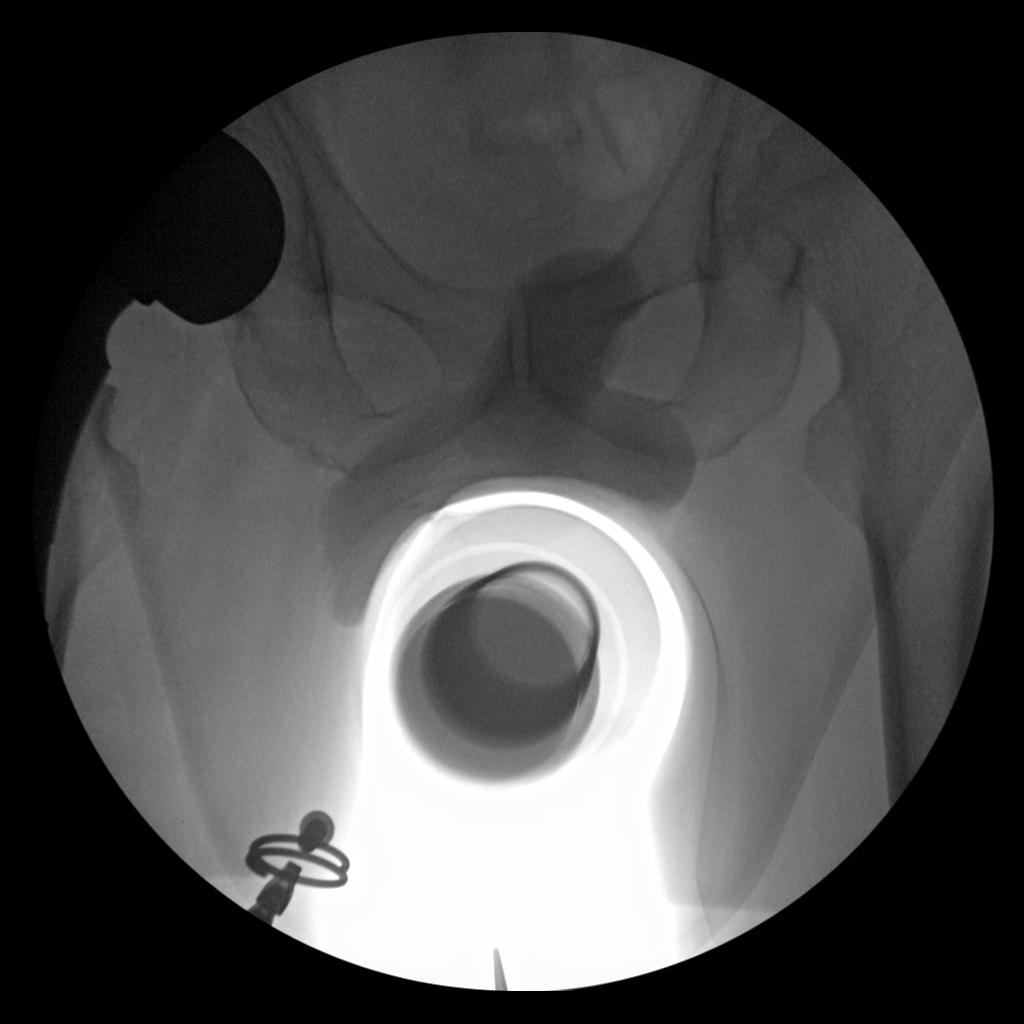

[2 of 2 positions shown; findings below may reference images not displayed]

FINDINGS: Changes of right hip replacement. Normal AP alignment. No visible
hardware complicating feature.
IMPRESSION: Right hip replacement.  No visible complicating feature.

## 2019-06-21 IMAGING — DX DG PORTABLE PELVIS
1 series · 1 of 1 positions shown · non-contrast
Comparison: None.

CLINICAL DATA: Status post right hip replacement

EXAM:
PORTABLE PELVIS 1-2 VIEWS

[pelvis ap]
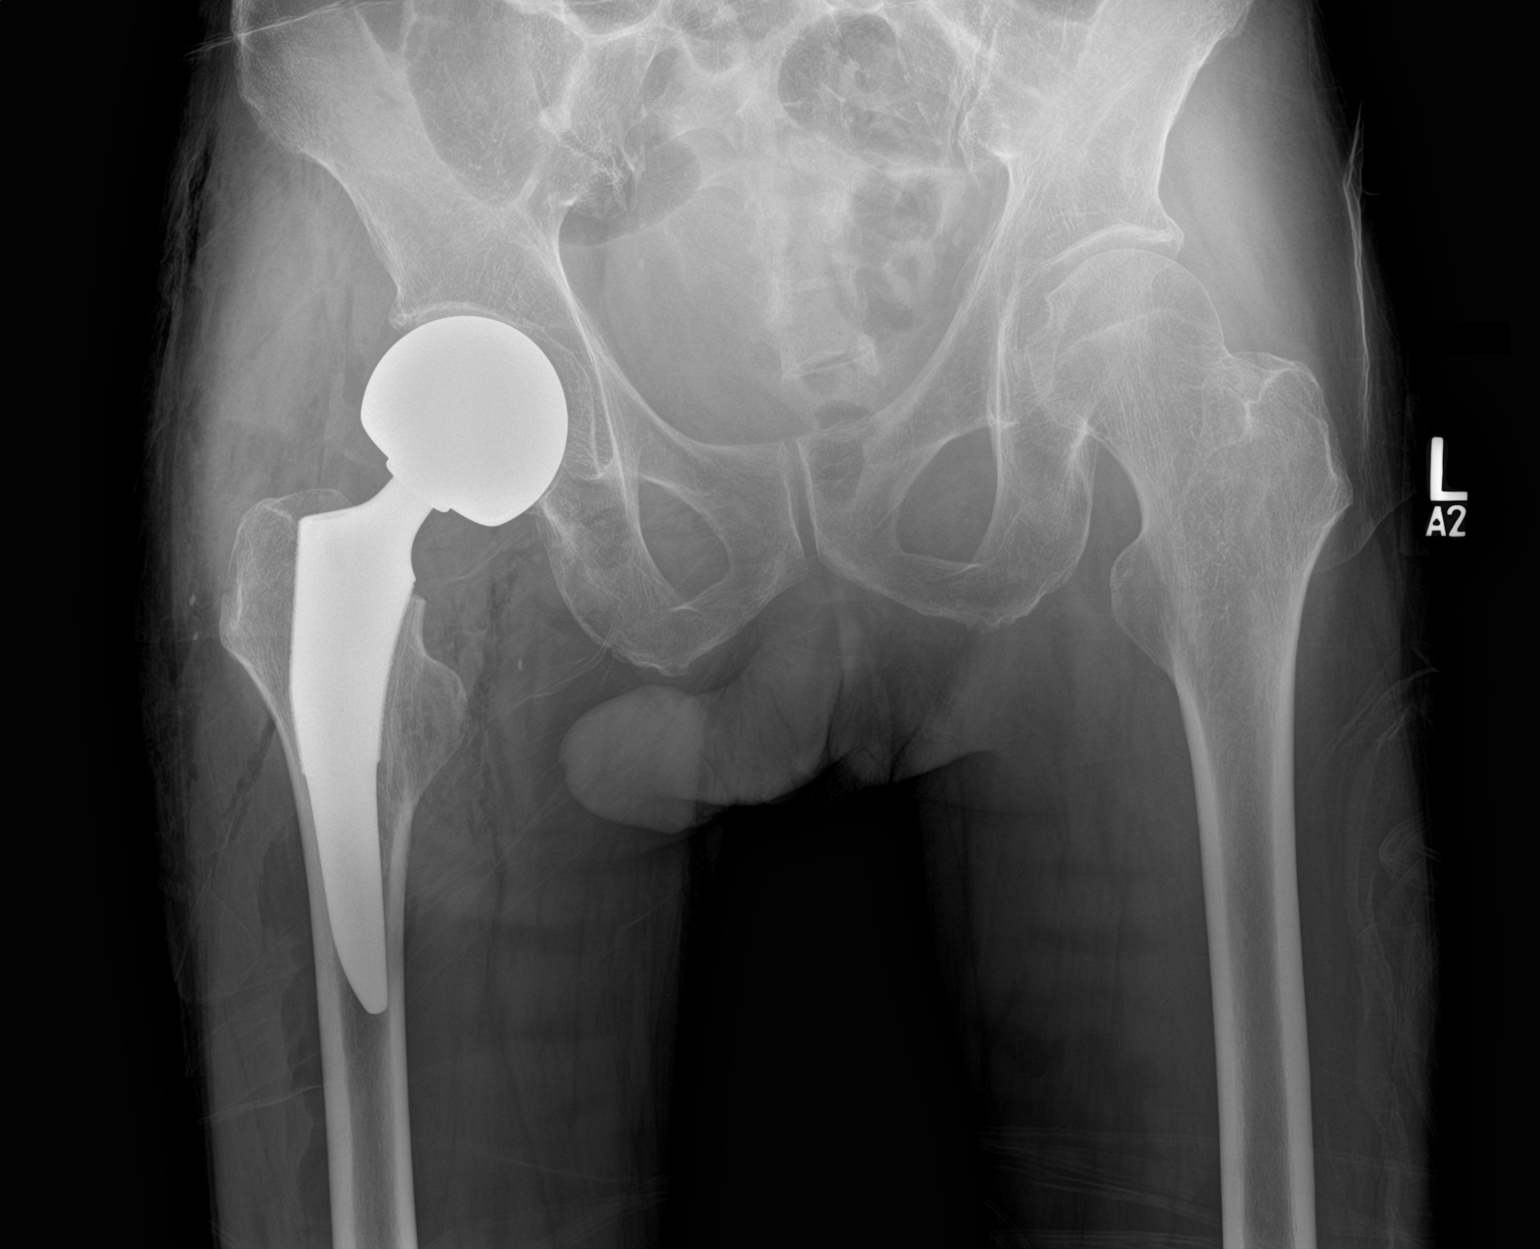

[1 of 1 positions shown; findings below may reference images not displayed]

FINDINGS: Right hip prosthesis is noted in satisfactory position. No acute
bony or soft tissue abnormality is seen.
IMPRESSION: Status post right hip replacement
